# Patient Record
Sex: Female | Born: 1940 | Race: White | Hispanic: No | Marital: Married | Smoking: Never smoker
Health system: Southern US, Community
[De-identification: ages and names within clinical notes are randomized; demographics above are authoritative.]

## PROBLEM LIST (undated history)

## (undated) DIAGNOSIS — C50919 Malignant neoplasm of unspecified site of unspecified female breast: Secondary | ICD-10-CM

---

## 1999-09-16 ENCOUNTER — Encounter: Admission: RE | Admit: 1999-09-16 | Discharge: 1999-09-16 | Payer: Self-pay | Admitting: Family Medicine

## 1999-09-16 ENCOUNTER — Encounter: Payer: Self-pay | Admitting: Family Medicine

## 1999-10-22 ENCOUNTER — Encounter: Admission: RE | Admit: 1999-10-22 | Discharge: 1999-10-22 | Payer: Self-pay | Admitting: Family Medicine

## 1999-10-22 ENCOUNTER — Encounter: Payer: Self-pay | Admitting: Family Medicine

## 2000-08-31 ENCOUNTER — Other Ambulatory Visit: Admission: RE | Admit: 2000-08-31 | Discharge: 2000-08-31 | Payer: Self-pay | Admitting: Family Medicine

## 2000-12-06 ENCOUNTER — Encounter: Payer: Self-pay | Admitting: Family Medicine

## 2000-12-06 ENCOUNTER — Encounter: Admission: RE | Admit: 2000-12-06 | Discharge: 2000-12-06 | Payer: Self-pay | Admitting: Family Medicine

## 2002-03-16 ENCOUNTER — Encounter: Payer: Self-pay | Admitting: Family Medicine

## 2002-03-16 ENCOUNTER — Encounter: Admission: RE | Admit: 2002-03-16 | Discharge: 2002-03-16 | Payer: Self-pay | Admitting: Family Medicine

## 2004-05-01 ENCOUNTER — Encounter: Admission: RE | Admit: 2004-05-01 | Discharge: 2004-05-01 | Payer: Self-pay | Admitting: Family Medicine

## 2004-05-14 ENCOUNTER — Other Ambulatory Visit: Admission: RE | Admit: 2004-05-14 | Discharge: 2004-05-14 | Payer: Self-pay | Admitting: Family Medicine

## 2005-06-01 ENCOUNTER — Encounter: Admission: RE | Admit: 2005-06-01 | Discharge: 2005-06-01 | Payer: Self-pay | Admitting: Family Medicine

## 2006-04-14 ENCOUNTER — Other Ambulatory Visit: Admission: RE | Admit: 2006-04-14 | Discharge: 2006-04-14 | Payer: Self-pay | Admitting: Family Medicine

## 2006-06-30 ENCOUNTER — Encounter: Admission: RE | Admit: 2006-06-30 | Discharge: 2006-06-30 | Payer: Self-pay | Admitting: Family Medicine

## 2006-07-13 ENCOUNTER — Encounter (INDEPENDENT_AMBULATORY_CARE_PROVIDER_SITE_OTHER): Payer: Self-pay | Admitting: Diagnostic Radiology

## 2006-07-13 ENCOUNTER — Encounter: Admission: RE | Admit: 2006-07-13 | Discharge: 2006-07-13 | Payer: Self-pay | Admitting: Family Medicine

## 2006-07-13 ENCOUNTER — Encounter (INDEPENDENT_AMBULATORY_CARE_PROVIDER_SITE_OTHER): Payer: Self-pay | Admitting: Specialist

## 2006-07-25 ENCOUNTER — Encounter: Admission: RE | Admit: 2006-07-25 | Discharge: 2006-07-25 | Payer: Self-pay | Admitting: Family Medicine

## 2006-07-27 ENCOUNTER — Encounter: Admission: RE | Admit: 2006-07-27 | Discharge: 2006-07-27 | Payer: Self-pay | Admitting: Family Medicine

## 2006-08-19 ENCOUNTER — Encounter: Admission: RE | Admit: 2006-08-19 | Discharge: 2006-08-19 | Payer: Self-pay | Admitting: Surgery

## 2006-08-22 ENCOUNTER — Encounter (INDEPENDENT_AMBULATORY_CARE_PROVIDER_SITE_OTHER): Payer: Self-pay | Admitting: *Deleted

## 2006-08-22 ENCOUNTER — Ambulatory Visit (HOSPITAL_BASED_OUTPATIENT_CLINIC_OR_DEPARTMENT_OTHER): Admission: RE | Admit: 2006-08-22 | Discharge: 2006-08-22 | Payer: Self-pay | Admitting: Surgery

## 2006-08-22 ENCOUNTER — Encounter: Admission: RE | Admit: 2006-08-22 | Discharge: 2006-08-22 | Payer: Self-pay | Admitting: Surgery

## 2006-09-15 ENCOUNTER — Ambulatory Visit: Payer: Self-pay | Admitting: Oncology

## 2006-09-20 HISTORY — PX: BREAST LUMPECTOMY: SHX2

## 2006-09-21 ENCOUNTER — Ambulatory Visit: Admission: RE | Admit: 2006-09-21 | Discharge: 2006-12-10 | Payer: Self-pay | Admitting: *Deleted

## 2006-09-22 ENCOUNTER — Encounter: Admission: RE | Admit: 2006-09-22 | Discharge: 2006-09-22 | Payer: Self-pay | Admitting: Surgery

## 2006-09-22 ENCOUNTER — Encounter (INDEPENDENT_AMBULATORY_CARE_PROVIDER_SITE_OTHER): Payer: Self-pay | Admitting: Specialist

## 2006-10-03 LAB — COMPREHENSIVE METABOLIC PANEL
Albumin: 4.8 g/dL (ref 3.5–5.2)
CO2: 24 mEq/L (ref 19–32)
Calcium: 9.5 mg/dL (ref 8.4–10.5)
Chloride: 106 mEq/L (ref 96–112)
Glucose, Bld: 85 mg/dL (ref 70–99)
Potassium: 3.6 mEq/L (ref 3.5–5.3)
Sodium: 136 mEq/L (ref 135–145)
Total Protein: 7.6 g/dL (ref 6.0–8.3)

## 2006-10-03 LAB — CBC WITH DIFFERENTIAL/PLATELET
Basophils Absolute: 0 10*3/uL (ref 0.0–0.1)
Eosinophils Absolute: 0.2 10*3/uL (ref 0.0–0.5)
HGB: 12.8 g/dL (ref 11.6–15.9)
MONO#: 0.7 10*3/uL (ref 0.1–0.9)
NEUT#: 3.7 10*3/uL (ref 1.5–6.5)
RBC: 4.25 10*6/uL (ref 3.70–5.32)
RDW: 12.9 % (ref 11.3–14.5)
WBC: 6.2 10*3/uL (ref 3.9–10.0)
lymph#: 1.6 10*3/uL (ref 0.9–3.3)

## 2007-06-16 ENCOUNTER — Ambulatory Visit: Payer: Self-pay | Admitting: Oncology

## 2007-06-20 LAB — LIPID PANEL
Cholesterol: 187 mg/dL (ref 0–200)
LDL Cholesterol: 98 mg/dL (ref 0–99)
Triglycerides: 121 mg/dL (ref <150)

## 2007-07-03 ENCOUNTER — Encounter: Admission: RE | Admit: 2007-07-03 | Discharge: 2007-07-03 | Payer: Self-pay | Admitting: Oncology

## 2008-02-02 ENCOUNTER — Ambulatory Visit: Payer: Self-pay | Admitting: Oncology

## 2008-03-08 ENCOUNTER — Other Ambulatory Visit: Admission: RE | Admit: 2008-03-08 | Discharge: 2008-03-08 | Payer: Self-pay | Admitting: Family Medicine

## 2008-07-03 ENCOUNTER — Encounter: Admission: RE | Admit: 2008-07-03 | Discharge: 2008-07-03 | Payer: Self-pay | Admitting: Oncology

## 2009-03-11 ENCOUNTER — Ambulatory Visit: Payer: Self-pay | Admitting: Oncology

## 2009-03-13 LAB — COMPREHENSIVE METABOLIC PANEL
Albumin: 4.1 g/dL (ref 3.5–5.2)
BUN: 20 mg/dL (ref 6–23)
Calcium: 9.4 mg/dL (ref 8.4–10.5)
Chloride: 104 mEq/L (ref 96–112)
Creatinine, Ser: 0.89 mg/dL (ref 0.40–1.20)
Glucose, Bld: 87 mg/dL (ref 70–99)
Potassium: 4.4 mEq/L (ref 3.5–5.3)

## 2009-03-13 LAB — CBC WITH DIFFERENTIAL/PLATELET
Basophils Absolute: 0 10*3/uL (ref 0.0–0.1)
EOS%: 3 % (ref 0.0–7.0)
Eosinophils Absolute: 0.1 10*3/uL (ref 0.0–0.5)
HCT: 36.5 % (ref 34.8–46.6)
HGB: 12.5 g/dL (ref 11.6–15.9)
MCH: 29.9 pg (ref 25.1–34.0)
MCV: 87.2 fL (ref 79.5–101.0)
MONO%: 10.2 % (ref 0.0–14.0)
NEUT#: 2.5 10*3/uL (ref 1.5–6.5)
NEUT%: 57.6 % (ref 38.4–76.8)
RDW: 12.8 % (ref 11.2–14.5)
lymph#: 1.2 10*3/uL (ref 0.9–3.3)

## 2009-08-06 ENCOUNTER — Encounter: Admission: RE | Admit: 2009-08-06 | Discharge: 2009-08-06 | Payer: Self-pay | Admitting: Oncology

## 2010-03-06 ENCOUNTER — Ambulatory Visit: Payer: Self-pay | Admitting: Oncology

## 2010-03-10 LAB — COMPREHENSIVE METABOLIC PANEL
ALT: 14 U/L (ref 0–35)
AST: 20 U/L (ref 0–37)
CO2: 23 mEq/L (ref 19–32)
Creatinine, Ser: 0.94 mg/dL (ref 0.40–1.20)
Sodium: 137 mEq/L (ref 135–145)
Total Bilirubin: 0.4 mg/dL (ref 0.3–1.2)
Total Protein: 7 g/dL (ref 6.0–8.3)

## 2010-03-10 LAB — CBC WITH DIFFERENTIAL/PLATELET
BASO%: 0.7 % (ref 0.0–2.0)
EOS%: 2.1 % (ref 0.0–7.0)
LYMPH%: 31.1 % (ref 14.0–49.7)
MCH: 29.9 pg (ref 25.1–34.0)
MCHC: 34 g/dL (ref 31.5–36.0)
MONO#: 0.4 10*3/uL (ref 0.1–0.9)
Platelets: 197 10*3/uL (ref 145–400)
RBC: 4.11 10*6/uL (ref 3.70–5.45)
WBC: 4 10*3/uL (ref 3.9–10.3)
lymph#: 1.2 10*3/uL (ref 0.9–3.3)

## 2010-03-10 LAB — CANCER ANTIGEN 27.29: CA 27.29: 21 U/mL (ref 0–39)

## 2010-08-19 ENCOUNTER — Encounter: Admission: RE | Admit: 2010-08-19 | Discharge: 2010-08-19 | Payer: Self-pay | Admitting: Oncology

## 2011-02-05 NOTE — Op Note (Signed)
NAME:  Jasmine Porter, Jasmine Porter               ACCOUNT NO.:  [id number]   MEDICAL RECORD NO.:  [id number]          PATIENT TYPE:  AMB   LOCATION:  DSC                          FACILITY:  [facility]   PHYSICIAN:  Thomas A. 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   PROCEDURE:  Right breast needle localized lumpectomy.   SURGEON:  Harriette Bouillon, M.D.   ANESTHESIA:  MAC with 30 mL of 0.25% Sensorcaine plain.   ESTIMATED BLOOD LOSS:  10 mL.   SPECIMEN:  1. Right breast tissue with localizing wire to pathology.  Radiographs      revealed calcifications to be present.  2. Additional medial margin with stitch on new medial surgical margin.   INDICATIONS FOR PROCEDURE:  The patient is a 70 year old female found to  have a cluster of right breast microcalcifications.  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  Of  note, she had a clip placed, but this clip had migrated this was noted  at the time of her stereotactic core biopsy away from the  calcifications.  She presents today for excisional biopsy and she  elected to undergo a right breast lumpectomy for breast conservation for  DCIS of the right breast.   DESCRIPTION OF PROCEDURE:  The patient was brought to the operating room  after undergoing right breast needle localization.  Right breast was  prepped and draped in a sterile fashion.  The wire was localized and I  infiltrated 0.25% Sensorcaine in the right upper outer quadrant.  Incision was made and the wire was brought out through the incision.  The tissue surrounding the wire was excised using a scalpel and this was  passed off the field.  Radiograph revealed this  to contain the cluster  of microcalcifications.  There appeared to be a fairly generous margin  around this.  I took an additional medial margin of tissue as well since  the clip had migrated medial to make for this margin was clear and  grossly it appeared to be clear.  I then inspected the wound and found  it to be hemostatic.  This wound was irrigated out.  I closed the wound  in layers using 3-0 Vicryl and 4-0 Monocryl in  subcuticular fashion.  Steri-Strips and dry dressings were applied.  All  final counts of sponge, needle and instruments were found be correct  this portion of case.  The patient was awoke, taken to recovery in  satisfactory condition.  Tissue sent to pathology for evaluation.      Thomas A. Cornett, M.D.  Electronically Signed     TAC/MEDQ  D:  08/22/2006  T:  08/23/2006  Job:  [id number]   cc:   Duncan Dull, M.D.

## 2011-03-16 ENCOUNTER — Other Ambulatory Visit: Payer: Self-pay | Admitting: Oncology

## 2011-03-16 ENCOUNTER — Encounter (HOSPITAL_BASED_OUTPATIENT_CLINIC_OR_DEPARTMENT_OTHER): Payer: Medicare Other | Admitting: Oncology

## 2011-03-16 DIAGNOSIS — C50419 Malignant neoplasm of upper-outer quadrant of unspecified female breast: Secondary | ICD-10-CM

## 2011-03-16 DIAGNOSIS — D0590 Unspecified type of carcinoma in situ of unspecified breast: Secondary | ICD-10-CM

## 2011-03-16 DIAGNOSIS — Z170 Estrogen receptor positive status [ER+]: Secondary | ICD-10-CM

## 2011-03-16 LAB — CBC WITH DIFFERENTIAL/PLATELET
BASO%: 0.4 % (ref 0.0–2.0)
Basophils Absolute: 0 10*3/uL (ref 0.0–0.1)
EOS%: 2.4 % (ref 0.0–7.0)
HCT: 36.3 % (ref 34.8–46.6)
HGB: 12.4 g/dL (ref 11.6–15.9)
LYMPH%: 24.6 % (ref 14.0–49.7)
MCH: 30 pg (ref 25.1–34.0)
MCHC: 34.1 g/dL (ref 31.5–36.0)
MCV: 87.9 fL (ref 79.5–101.0)
MONO%: 10.6 % (ref 0.0–14.0)
NEUT%: 62 % (ref 38.4–76.8)
Platelets: 196 10*3/uL (ref 145–400)

## 2011-03-16 LAB — COMPREHENSIVE METABOLIC PANEL
ALT: 15 U/L (ref 0–35)
AST: 24 U/L (ref 0–37)
BUN: 15 mg/dL (ref 6–23)
Calcium: 9.6 mg/dL (ref 8.4–10.5)
Chloride: 102 mEq/L (ref 96–112)
Creatinine, Ser: 0.86 mg/dL (ref 0.50–1.10)
Total Bilirubin: 0.3 mg/dL (ref 0.3–1.2)

## 2011-08-03 ENCOUNTER — Other Ambulatory Visit: Payer: Self-pay | Admitting: Family Medicine

## 2011-08-03 DIAGNOSIS — Z853 Personal history of malignant neoplasm of breast: Secondary | ICD-10-CM

## 2011-08-03 DIAGNOSIS — Z9889 Other specified postprocedural states: Secondary | ICD-10-CM

## 2011-09-29 ENCOUNTER — Ambulatory Visit
Admission: RE | Admit: 2011-09-29 | Discharge: 2011-09-29 | Disposition: A | Discharge status: Home / Self Care | Payer: Medicare Other | Source: Ambulatory Visit | Attending: Family Medicine | Admitting: Family Medicine

## 2011-09-29 DIAGNOSIS — Z853 Personal history of malignant neoplasm of breast: Secondary | ICD-10-CM

## 2011-09-29 DIAGNOSIS — Z9889 Other specified postprocedural states: Secondary | ICD-10-CM

## 2012-03-29 DIAGNOSIS — H2510 Age-related nuclear cataract, unspecified eye: Secondary | ICD-10-CM | POA: Diagnosis not present

## 2012-03-29 DIAGNOSIS — H40019 Open angle with borderline findings, low risk, unspecified eye: Secondary | ICD-10-CM | POA: Diagnosis not present

## 2012-03-29 DIAGNOSIS — H353190 Nonexudative age-related macular degeneration, unspecified eye, stage unspecified: Secondary | ICD-10-CM | POA: Diagnosis not present

## 2012-03-29 DIAGNOSIS — H43819 Vitreous degeneration, unspecified eye: Secondary | ICD-10-CM | POA: Diagnosis not present

## 2012-07-18 DIAGNOSIS — Z23 Encounter for immunization: Secondary | ICD-10-CM | POA: Diagnosis not present

## 2012-10-17 ENCOUNTER — Other Ambulatory Visit: Payer: Self-pay | Admitting: Family Medicine

## 2012-10-17 DIAGNOSIS — Z853 Personal history of malignant neoplasm of breast: Secondary | ICD-10-CM

## 2012-10-19 ENCOUNTER — Other Ambulatory Visit: Payer: Self-pay | Admitting: Dermatology

## 2012-10-19 DIAGNOSIS — L820 Inflamed seborrheic keratosis: Secondary | ICD-10-CM | POA: Diagnosis not present

## 2012-10-19 DIAGNOSIS — D485 Neoplasm of uncertain behavior of skin: Secondary | ICD-10-CM | POA: Diagnosis not present

## 2012-12-06 ENCOUNTER — Ambulatory Visit
Admission: RE | Admit: 2012-12-06 | Discharge: 2012-12-06 | Disposition: A | Discharge status: Home / Self Care | Payer: Medicare Other | Source: Ambulatory Visit | Attending: Family Medicine | Admitting: Family Medicine

## 2012-12-06 DIAGNOSIS — Z853 Personal history of malignant neoplasm of breast: Secondary | ICD-10-CM | POA: Diagnosis not present

## 2013-04-04 DIAGNOSIS — H353190 Nonexudative age-related macular degeneration, unspecified eye, stage unspecified: Secondary | ICD-10-CM | POA: Diagnosis not present

## 2013-04-04 DIAGNOSIS — H2510 Age-related nuclear cataract, unspecified eye: Secondary | ICD-10-CM | POA: Diagnosis not present

## 2013-04-04 DIAGNOSIS — H43819 Vitreous degeneration, unspecified eye: Secondary | ICD-10-CM | POA: Diagnosis not present

## 2013-04-04 DIAGNOSIS — H40019 Open angle with borderline findings, low risk, unspecified eye: Secondary | ICD-10-CM | POA: Diagnosis not present

## 2013-07-02 DIAGNOSIS — Z23 Encounter for immunization: Secondary | ICD-10-CM | POA: Diagnosis not present

## 2013-10-23 ENCOUNTER — Telehealth (INDEPENDENT_AMBULATORY_CARE_PROVIDER_SITE_OTHER): Payer: Self-pay

## 2013-10-23 NOTE — Telephone Encounter (Signed)
No, I did not ask that question because I was trying to get her extensive medical history.

## 2013-10-23 NOTE — Telephone Encounter (Signed)
Spoke with home health and gave them number to reach pt if they had further questions. I spoke with pt's daughter via phone and she said her port [facility] and the Dr.  that installed the port has since passed away. Faxed demographics and this info to [facility]. KM

## 2013-10-23 NOTE — Telephone Encounter (Signed)
WRONG CHART, please dismiss. KM

## 2013-10-23 NOTE — Telephone Encounter (Signed)
[facility] calls asking if you know who is maintaining pt's port?

## 2013-12-13 ENCOUNTER — Other Ambulatory Visit: Payer: Self-pay

## 2013-12-13 DIAGNOSIS — Z1231 Encounter for screening mammogram for malignant neoplasm of breast: Secondary | ICD-10-CM

## 2014-01-01 ENCOUNTER — Ambulatory Visit
Admission: RE | Admit: 2014-01-01 | Discharge: 2014-01-01 | Disposition: A | Discharge status: Home / Self Care | Payer: Medicare Other | Source: Ambulatory Visit

## 2014-01-01 DIAGNOSIS — Z1231 Encounter for screening mammogram for malignant neoplasm of breast: Secondary | ICD-10-CM

## 2014-01-01 DIAGNOSIS — Z853 Personal history of malignant neoplasm of breast: Secondary | ICD-10-CM | POA: Diagnosis not present

## 2014-06-24 DIAGNOSIS — Z136 Encounter for screening for cardiovascular disorders: Secondary | ICD-10-CM | POA: Diagnosis not present

## 2014-06-24 DIAGNOSIS — Z0000 Encounter for general adult medical examination without abnormal findings: Secondary | ICD-10-CM | POA: Diagnosis not present

## 2014-06-24 DIAGNOSIS — Z23 Encounter for immunization: Secondary | ICD-10-CM | POA: Diagnosis not present

## 2014-06-24 DIAGNOSIS — M810 Age-related osteoporosis without current pathological fracture: Secondary | ICD-10-CM | POA: Diagnosis not present

## 2014-06-24 DIAGNOSIS — Z13220 Encounter for screening for lipoid disorders: Secondary | ICD-10-CM | POA: Diagnosis not present

## 2014-06-24 DIAGNOSIS — Z131 Encounter for screening for diabetes mellitus: Secondary | ICD-10-CM | POA: Diagnosis not present

## 2014-06-24 DIAGNOSIS — Z1389 Encounter for screening for other disorder: Secondary | ICD-10-CM | POA: Diagnosis not present

## 2014-06-24 DIAGNOSIS — E559 Vitamin D deficiency, unspecified: Secondary | ICD-10-CM | POA: Diagnosis not present

## 2014-09-26 DIAGNOSIS — E559 Vitamin D deficiency, unspecified: Secondary | ICD-10-CM | POA: Diagnosis not present

## 2014-10-03 DIAGNOSIS — M810 Age-related osteoporosis without current pathological fracture: Secondary | ICD-10-CM | POA: Diagnosis not present

## 2014-11-27 DIAGNOSIS — M810 Age-related osteoporosis without current pathological fracture: Secondary | ICD-10-CM | POA: Diagnosis not present

## 2014-11-27 DIAGNOSIS — E559 Vitamin D deficiency, unspecified: Secondary | ICD-10-CM | POA: Diagnosis not present

## 2014-12-26 ENCOUNTER — Other Ambulatory Visit: Payer: Self-pay

## 2014-12-26 DIAGNOSIS — Z1231 Encounter for screening mammogram for malignant neoplasm of breast: Secondary | ICD-10-CM

## 2015-01-14 ENCOUNTER — Ambulatory Visit
Admission: RE | Admit: 2015-01-14 | Discharge: 2015-01-14 | Disposition: A | Discharge status: Home / Self Care | Payer: Medicare Other | Source: Ambulatory Visit

## 2015-01-14 DIAGNOSIS — Z1231 Encounter for screening mammogram for malignant neoplasm of breast: Secondary | ICD-10-CM | POA: Diagnosis not present

## 2015-06-25 DIAGNOSIS — Z131 Encounter for screening for diabetes mellitus: Secondary | ICD-10-CM | POA: Diagnosis not present

## 2015-06-25 DIAGNOSIS — E559 Vitamin D deficiency, unspecified: Secondary | ICD-10-CM | POA: Diagnosis not present

## 2015-06-25 DIAGNOSIS — E7800 Pure hypercholesterolemia, unspecified: Secondary | ICD-10-CM | POA: Diagnosis not present

## 2015-07-02 DIAGNOSIS — E7800 Pure hypercholesterolemia, unspecified: Secondary | ICD-10-CM | POA: Diagnosis not present

## 2015-07-02 DIAGNOSIS — E559 Vitamin D deficiency, unspecified: Secondary | ICD-10-CM | POA: Diagnosis not present

## 2015-07-02 DIAGNOSIS — Z23 Encounter for immunization: Secondary | ICD-10-CM | POA: Diagnosis not present

## 2015-07-02 DIAGNOSIS — M810 Age-related osteoporosis without current pathological fracture: Secondary | ICD-10-CM | POA: Diagnosis not present

## 2015-07-02 DIAGNOSIS — Z0000 Encounter for general adult medical examination without abnormal findings: Secondary | ICD-10-CM | POA: Diagnosis not present

## 2015-07-16 DIAGNOSIS — H40013 Open angle with borderline findings, low risk, bilateral: Secondary | ICD-10-CM | POA: Diagnosis not present

## 2015-07-16 DIAGNOSIS — H353130 Nonexudative age-related macular degeneration, bilateral, stage unspecified: Secondary | ICD-10-CM | POA: Diagnosis not present

## 2015-07-16 DIAGNOSIS — H25013 Cortical age-related cataract, bilateral: Secondary | ICD-10-CM | POA: Diagnosis not present

## 2015-07-16 DIAGNOSIS — H2513 Age-related nuclear cataract, bilateral: Secondary | ICD-10-CM | POA: Diagnosis not present

## 2015-12-31 ENCOUNTER — Other Ambulatory Visit: Payer: Self-pay

## 2015-12-31 DIAGNOSIS — Z1231 Encounter for screening mammogram for malignant neoplasm of breast: Secondary | ICD-10-CM

## 2016-01-29 ENCOUNTER — Ambulatory Visit
Admission: RE | Admit: 2016-01-29 | Discharge: 2016-01-29 | Disposition: A | Discharge status: Home / Self Care | Payer: Medicare Other | Source: Ambulatory Visit

## 2016-01-29 DIAGNOSIS — Z1231 Encounter for screening mammogram for malignant neoplasm of breast: Secondary | ICD-10-CM

## 2016-01-30 ENCOUNTER — Other Ambulatory Visit: Payer: Self-pay | Admitting: Family Medicine

## 2016-01-30 DIAGNOSIS — R928 Other abnormal and inconclusive findings on diagnostic imaging of breast: Secondary | ICD-10-CM

## 2016-02-04 ENCOUNTER — Ambulatory Visit
Admission: RE | Admit: 2016-02-04 | Discharge: 2016-02-04 | Disposition: A | Discharge status: Home / Self Care | Payer: Medicare Other | Source: Ambulatory Visit | Attending: Family Medicine | Admitting: Family Medicine

## 2016-02-04 DIAGNOSIS — R928 Other abnormal and inconclusive findings on diagnostic imaging of breast: Secondary | ICD-10-CM

## 2016-02-12 DIAGNOSIS — H40013 Open angle with borderline findings, low risk, bilateral: Secondary | ICD-10-CM | POA: Diagnosis not present

## 2016-02-12 DIAGNOSIS — H40033 Anatomical narrow angle, bilateral: Secondary | ICD-10-CM | POA: Diagnosis not present

## 2016-06-30 DIAGNOSIS — E559 Vitamin D deficiency, unspecified: Secondary | ICD-10-CM | POA: Diagnosis not present

## 2016-06-30 DIAGNOSIS — Z131 Encounter for screening for diabetes mellitus: Secondary | ICD-10-CM | POA: Diagnosis not present

## 2016-06-30 DIAGNOSIS — E7800 Pure hypercholesterolemia, unspecified: Secondary | ICD-10-CM | POA: Diagnosis not present

## 2016-07-07 DIAGNOSIS — Z0000 Encounter for general adult medical examination without abnormal findings: Secondary | ICD-10-CM | POA: Diagnosis not present

## 2016-07-07 DIAGNOSIS — M810 Age-related osteoporosis without current pathological fracture: Secondary | ICD-10-CM | POA: Diagnosis not present

## 2016-07-07 DIAGNOSIS — E559 Vitamin D deficiency, unspecified: Secondary | ICD-10-CM | POA: Diagnosis not present

## 2016-07-07 DIAGNOSIS — E7800 Pure hypercholesterolemia, unspecified: Secondary | ICD-10-CM | POA: Diagnosis not present

## 2016-07-07 DIAGNOSIS — Z23 Encounter for immunization: Secondary | ICD-10-CM | POA: Diagnosis not present

## 2016-09-22 ENCOUNTER — Other Ambulatory Visit: Payer: Self-pay | Admitting: Family Medicine

## 2016-09-22 DIAGNOSIS — R234 Changes in skin texture: Secondary | ICD-10-CM

## 2016-09-24 ENCOUNTER — Ambulatory Visit
Admission: RE | Admit: 2016-09-24 | Discharge: 2016-09-24 | Disposition: A | Discharge status: Home / Self Care | Payer: Medicare Other | Source: Ambulatory Visit | Attending: Family Medicine | Admitting: Family Medicine

## 2016-09-24 DIAGNOSIS — R234 Changes in skin texture: Secondary | ICD-10-CM

## 2016-09-24 DIAGNOSIS — R928 Other abnormal and inconclusive findings on diagnostic imaging of breast: Secondary | ICD-10-CM | POA: Diagnosis not present

## 2016-09-24 DIAGNOSIS — N6489 Other specified disorders of breast: Secondary | ICD-10-CM | POA: Diagnosis not present

## 2016-10-13 DIAGNOSIS — H40013 Open angle with borderline findings, low risk, bilateral: Secondary | ICD-10-CM | POA: Diagnosis not present

## 2016-10-13 DIAGNOSIS — H353130 Nonexudative age-related macular degeneration, bilateral, stage unspecified: Secondary | ICD-10-CM | POA: Diagnosis not present

## 2016-10-13 DIAGNOSIS — H2513 Age-related nuclear cataract, bilateral: Secondary | ICD-10-CM | POA: Diagnosis not present

## 2016-10-13 DIAGNOSIS — H40033 Anatomical narrow angle, bilateral: Secondary | ICD-10-CM | POA: Diagnosis not present

## 2017-04-14 DIAGNOSIS — H40013 Open angle with borderline findings, low risk, bilateral: Secondary | ICD-10-CM | POA: Diagnosis not present

## 2017-04-14 DIAGNOSIS — H40033 Anatomical narrow angle, bilateral: Secondary | ICD-10-CM | POA: Diagnosis not present

## 2017-06-24 DIAGNOSIS — Z23 Encounter for immunization: Secondary | ICD-10-CM | POA: Diagnosis not present

## 2017-07-21 DIAGNOSIS — E559 Vitamin D deficiency, unspecified: Secondary | ICD-10-CM | POA: Diagnosis not present

## 2017-07-21 DIAGNOSIS — Z0000 Encounter for general adult medical examination without abnormal findings: Secondary | ICD-10-CM | POA: Diagnosis not present

## 2017-07-21 DIAGNOSIS — E7800 Pure hypercholesterolemia, unspecified: Secondary | ICD-10-CM | POA: Diagnosis not present

## 2017-07-21 DIAGNOSIS — Z131 Encounter for screening for diabetes mellitus: Secondary | ICD-10-CM | POA: Diagnosis not present

## 2017-07-21 DIAGNOSIS — D692 Other nonthrombocytopenic purpura: Secondary | ICD-10-CM | POA: Diagnosis not present

## 2017-07-21 DIAGNOSIS — M810 Age-related osteoporosis without current pathological fracture: Secondary | ICD-10-CM | POA: Diagnosis not present

## 2017-10-18 DIAGNOSIS — H40013 Open angle with borderline findings, low risk, bilateral: Secondary | ICD-10-CM | POA: Diagnosis not present

## 2017-10-18 DIAGNOSIS — H25013 Cortical age-related cataract, bilateral: Secondary | ICD-10-CM | POA: Diagnosis not present

## 2017-10-18 DIAGNOSIS — H40033 Anatomical narrow angle, bilateral: Secondary | ICD-10-CM | POA: Diagnosis not present

## 2017-10-18 DIAGNOSIS — H2513 Age-related nuclear cataract, bilateral: Secondary | ICD-10-CM | POA: Diagnosis not present

## 2017-11-11 ENCOUNTER — Other Ambulatory Visit: Payer: Self-pay | Admitting: Family Medicine

## 2017-11-11 DIAGNOSIS — Z139 Encounter for screening, unspecified: Secondary | ICD-10-CM

## 2017-12-05 ENCOUNTER — Ambulatory Visit
Admission: RE | Admit: 2017-12-05 | Discharge: 2017-12-05 | Disposition: A | Discharge status: Home / Self Care | Payer: Medicare Other | Source: Ambulatory Visit | Attending: Family Medicine | Admitting: Family Medicine

## 2017-12-05 DIAGNOSIS — Z1231 Encounter for screening mammogram for malignant neoplasm of breast: Secondary | ICD-10-CM | POA: Diagnosis not present

## 2017-12-05 DIAGNOSIS — Z139 Encounter for screening, unspecified: Secondary | ICD-10-CM

## 2018-04-25 DIAGNOSIS — H40013 Open angle with borderline findings, low risk, bilateral: Secondary | ICD-10-CM | POA: Diagnosis not present

## 2018-04-25 DIAGNOSIS — H40033 Anatomical narrow angle, bilateral: Secondary | ICD-10-CM | POA: Diagnosis not present

## 2018-06-27 DIAGNOSIS — Z23 Encounter for immunization: Secondary | ICD-10-CM | POA: Diagnosis not present

## 2018-07-19 DIAGNOSIS — L564 Polymorphous light eruption: Secondary | ICD-10-CM | POA: Diagnosis not present

## 2018-07-27 DIAGNOSIS — E7800 Pure hypercholesterolemia, unspecified: Secondary | ICD-10-CM | POA: Diagnosis not present

## 2018-07-27 DIAGNOSIS — M810 Age-related osteoporosis without current pathological fracture: Secondary | ICD-10-CM | POA: Diagnosis not present

## 2018-07-27 DIAGNOSIS — Z23 Encounter for immunization: Secondary | ICD-10-CM | POA: Diagnosis not present

## 2018-07-27 DIAGNOSIS — Z131 Encounter for screening for diabetes mellitus: Secondary | ICD-10-CM | POA: Diagnosis not present

## 2018-07-27 DIAGNOSIS — Z0000 Encounter for general adult medical examination without abnormal findings: Secondary | ICD-10-CM | POA: Diagnosis not present

## 2018-07-27 DIAGNOSIS — E559 Vitamin D deficiency, unspecified: Secondary | ICD-10-CM | POA: Diagnosis not present

## 2018-10-23 DIAGNOSIS — H40033 Anatomical narrow angle, bilateral: Secondary | ICD-10-CM | POA: Diagnosis not present

## 2018-10-23 DIAGNOSIS — H2513 Age-related nuclear cataract, bilateral: Secondary | ICD-10-CM | POA: Diagnosis not present

## 2018-10-23 DIAGNOSIS — H353132 Nonexudative age-related macular degeneration, bilateral, intermediate dry stage: Secondary | ICD-10-CM | POA: Diagnosis not present

## 2018-10-23 DIAGNOSIS — H40013 Open angle with borderline findings, low risk, bilateral: Secondary | ICD-10-CM | POA: Diagnosis not present

## 2019-01-19 ENCOUNTER — Other Ambulatory Visit: Payer: Self-pay | Admitting: Family Medicine

## 2019-01-19 DIAGNOSIS — Z1231 Encounter for screening mammogram for malignant neoplasm of breast: Secondary | ICD-10-CM

## 2019-03-27 ENCOUNTER — Ambulatory Visit
Admission: RE | Admit: 2019-03-27 | Discharge: 2019-03-27 | Disposition: A | Discharge status: Home / Self Care | Payer: Medicare Other | Source: Ambulatory Visit | Attending: Family Medicine | Admitting: Family Medicine

## 2019-03-27 DIAGNOSIS — Z1231 Encounter for screening mammogram for malignant neoplasm of breast: Secondary | ICD-10-CM

## 2019-04-23 DIAGNOSIS — H40033 Anatomical narrow angle, bilateral: Secondary | ICD-10-CM | POA: Diagnosis not present

## 2019-04-23 DIAGNOSIS — H40013 Open angle with borderline findings, low risk, bilateral: Secondary | ICD-10-CM | POA: Diagnosis not present

## 2019-06-22 DIAGNOSIS — Z23 Encounter for immunization: Secondary | ICD-10-CM | POA: Diagnosis not present

## 2019-10-12 ENCOUNTER — Ambulatory Visit: Payer: Medicare Other | Attending: Family Medicine

## 2019-10-12 DIAGNOSIS — Z23 Encounter for immunization: Secondary | ICD-10-CM | POA: Insufficient documentation

## 2019-10-12 NOTE — Progress Notes (Signed)
   Covid-19 Vaccination Clinic  Name:  Jasmine Porter    MRN: [id number] DOB: 1941-05-01  10/12/2019  Jasmine Porter was observed post Covid-19 immunization for 15 minutes without incidence. She was provided with Vaccine Information Sheet and instruction to access the V-Safe system.   Jasmine Porter was instructed to call 911 with any severe reactions post vaccine: Marland Kitchen Difficulty breathing  . Swelling of your face and throat  . A fast heartbeat  . A bad rash all over your body  . Dizziness and weakness    Immunizations Administered    Name Date Dose VIS Date Route   Pfizer COVID-19 Vaccine 10/12/2019 10:57 AM 0.3 mL 08/31/2019 Intramuscular   Manufacturer: [place]   Lot: [id number]   [place]: [id number]

## 2019-10-23 DIAGNOSIS — L309 Dermatitis, unspecified: Secondary | ICD-10-CM | POA: Diagnosis not present

## 2019-10-23 DIAGNOSIS — L820 Inflamed seborrheic keratosis: Secondary | ICD-10-CM | POA: Diagnosis not present

## 2019-10-23 DIAGNOSIS — D485 Neoplasm of uncertain behavior of skin: Secondary | ICD-10-CM | POA: Diagnosis not present

## 2019-10-23 DIAGNOSIS — L570 Actinic keratosis: Secondary | ICD-10-CM | POA: Diagnosis not present

## 2019-10-23 DIAGNOSIS — Z23 Encounter for immunization: Secondary | ICD-10-CM | POA: Diagnosis not present

## 2019-10-25 DIAGNOSIS — H25013 Cortical age-related cataract, bilateral: Secondary | ICD-10-CM | POA: Diagnosis not present

## 2019-10-25 DIAGNOSIS — H40013 Open angle with borderline findings, low risk, bilateral: Secondary | ICD-10-CM | POA: Diagnosis not present

## 2019-10-25 DIAGNOSIS — H2513 Age-related nuclear cataract, bilateral: Secondary | ICD-10-CM | POA: Diagnosis not present

## 2019-10-25 DIAGNOSIS — H40033 Anatomical narrow angle, bilateral: Secondary | ICD-10-CM | POA: Diagnosis not present

## 2019-11-02 ENCOUNTER — Ambulatory Visit: Payer: Medicare Other | Attending: Internal Medicine

## 2019-11-02 DIAGNOSIS — Z23 Encounter for immunization: Secondary | ICD-10-CM | POA: Insufficient documentation

## 2019-11-02 NOTE — Progress Notes (Signed)
   Covid-19 Vaccination Clinic  Name:  Jasmine Porter    MRN: [id number] DOB: 01/09/1941  11/02/2019  Jasmine Porter was observed post Covid-19 immunization for 30 minutes based on pre-vaccination screening without incidence. She was provided with Vaccine Information Sheet and instruction to access the V-Safe system.   Jasmine Porter was instructed to call 911 with any severe reactions post vaccine: Marland Kitchen Difficulty breathing  . Swelling of your face and throat  . A fast heartbeat  . A bad rash all over your body  . Dizziness and weakness    Immunizations Administered    Name Date Dose VIS Date Route   Pfizer COVID-19 Vaccine 11/02/2019  2:32 PM 0.3 mL 08/31/2019 Intramuscular   Manufacturer: [place]   Lot: [id number]   [place]: [id number]

## 2020-02-07 DIAGNOSIS — Z0000 Encounter for general adult medical examination without abnormal findings: Secondary | ICD-10-CM | POA: Diagnosis not present

## 2020-02-07 DIAGNOSIS — M810 Age-related osteoporosis without current pathological fracture: Secondary | ICD-10-CM | POA: Diagnosis not present

## 2020-02-07 DIAGNOSIS — E7800 Pure hypercholesterolemia, unspecified: Secondary | ICD-10-CM | POA: Diagnosis not present

## 2020-02-07 DIAGNOSIS — E559 Vitamin D deficiency, unspecified: Secondary | ICD-10-CM | POA: Diagnosis not present

## 2020-02-28 DIAGNOSIS — H40013 Open angle with borderline findings, low risk, bilateral: Secondary | ICD-10-CM | POA: Diagnosis not present

## 2020-02-28 DIAGNOSIS — H25013 Cortical age-related cataract, bilateral: Secondary | ICD-10-CM | POA: Diagnosis not present

## 2020-02-28 DIAGNOSIS — H40033 Anatomical narrow angle, bilateral: Secondary | ICD-10-CM | POA: Diagnosis not present

## 2020-02-28 DIAGNOSIS — H2513 Age-related nuclear cataract, bilateral: Secondary | ICD-10-CM | POA: Diagnosis not present

## 2020-03-03 DIAGNOSIS — H40033 Anatomical narrow angle, bilateral: Secondary | ICD-10-CM | POA: Diagnosis not present

## 2020-03-03 DIAGNOSIS — H2513 Age-related nuclear cataract, bilateral: Secondary | ICD-10-CM | POA: Diagnosis not present

## 2020-03-03 DIAGNOSIS — H40013 Open angle with borderline findings, low risk, bilateral: Secondary | ICD-10-CM | POA: Diagnosis not present

## 2020-03-03 DIAGNOSIS — H2512 Age-related nuclear cataract, left eye: Secondary | ICD-10-CM | POA: Diagnosis not present

## 2020-03-03 DIAGNOSIS — H25013 Cortical age-related cataract, bilateral: Secondary | ICD-10-CM | POA: Diagnosis not present

## 2020-03-18 DIAGNOSIS — T7840XA Allergy, unspecified, initial encounter: Secondary | ICD-10-CM | POA: Diagnosis not present

## 2020-03-18 DIAGNOSIS — L309 Dermatitis, unspecified: Secondary | ICD-10-CM | POA: Diagnosis not present

## 2020-03-18 DIAGNOSIS — L570 Actinic keratosis: Secondary | ICD-10-CM | POA: Diagnosis not present

## 2020-04-02 DIAGNOSIS — H25812 Combined forms of age-related cataract, left eye: Secondary | ICD-10-CM | POA: Diagnosis not present

## 2020-04-02 DIAGNOSIS — H2512 Age-related nuclear cataract, left eye: Secondary | ICD-10-CM | POA: Diagnosis not present

## 2020-04-29 DIAGNOSIS — H2511 Age-related nuclear cataract, right eye: Secondary | ICD-10-CM | POA: Diagnosis not present

## 2020-04-29 DIAGNOSIS — H25011 Cortical age-related cataract, right eye: Secondary | ICD-10-CM | POA: Diagnosis not present

## 2020-04-30 ENCOUNTER — Other Ambulatory Visit: Payer: Self-pay | Admitting: Family Medicine

## 2020-04-30 DIAGNOSIS — Z1231 Encounter for screening mammogram for malignant neoplasm of breast: Secondary | ICD-10-CM

## 2020-05-21 DIAGNOSIS — H25011 Cortical age-related cataract, right eye: Secondary | ICD-10-CM | POA: Diagnosis not present

## 2020-05-21 DIAGNOSIS — H25811 Combined forms of age-related cataract, right eye: Secondary | ICD-10-CM | POA: Diagnosis not present

## 2020-05-21 DIAGNOSIS — H2511 Age-related nuclear cataract, right eye: Secondary | ICD-10-CM | POA: Diagnosis not present

## 2020-06-10 ENCOUNTER — Other Ambulatory Visit: Payer: Self-pay

## 2020-06-10 ENCOUNTER — Ambulatory Visit
Admission: RE | Admit: 2020-06-10 | Discharge: 2020-06-10 | Disposition: A | Discharge status: Home / Self Care | Payer: Medicare Other | Source: Ambulatory Visit | Attending: Family Medicine | Admitting: Family Medicine

## 2020-06-10 DIAGNOSIS — Z1231 Encounter for screening mammogram for malignant neoplasm of breast: Secondary | ICD-10-CM

## 2020-06-10 HISTORY — DX: Malignant neoplasm of unspecified site of unspecified female breast: C50.919

## 2020-06-11 ENCOUNTER — Other Ambulatory Visit: Payer: Self-pay | Admitting: Family Medicine

## 2020-06-11 DIAGNOSIS — R928 Other abnormal and inconclusive findings on diagnostic imaging of breast: Secondary | ICD-10-CM

## 2020-06-26 ENCOUNTER — Ambulatory Visit: Payer: Medicare Other

## 2020-06-26 ENCOUNTER — Ambulatory Visit
Admission: RE | Admit: 2020-06-26 | Discharge: 2020-06-26 | Disposition: A | Discharge status: Home / Self Care | Payer: Medicare Other | Source: Ambulatory Visit | Attending: Family Medicine | Admitting: Family Medicine

## 2020-06-26 ENCOUNTER — Other Ambulatory Visit: Payer: Self-pay

## 2020-06-26 DIAGNOSIS — R922 Inconclusive mammogram: Secondary | ICD-10-CM | POA: Diagnosis not present

## 2020-06-26 DIAGNOSIS — R928 Other abnormal and inconclusive findings on diagnostic imaging of breast: Secondary | ICD-10-CM

## 2020-07-10 DIAGNOSIS — Z23 Encounter for immunization: Secondary | ICD-10-CM | POA: Diagnosis not present

## 2020-07-22 DIAGNOSIS — S8264XA Nondisplaced fracture of lateral malleolus of right fibula, initial encounter for closed fracture: Secondary | ICD-10-CM | POA: Diagnosis not present

## 2020-07-29 DIAGNOSIS — S8264XD Nondisplaced fracture of lateral malleolus of right fibula, subsequent encounter for closed fracture with routine healing: Secondary | ICD-10-CM | POA: Diagnosis not present

## 2020-08-02 ENCOUNTER — Other Ambulatory Visit: Payer: Self-pay

## 2020-08-02 ENCOUNTER — Ambulatory Visit: Payer: Medicare Other | Attending: Internal Medicine

## 2020-08-02 DIAGNOSIS — Z23 Encounter for immunization: Secondary | ICD-10-CM

## 2020-08-02 NOTE — Progress Notes (Signed)
   Covid-19 Vaccination Clinic  Name:  SHEROLYN TRETTIN    MRN: [id number] DOB: Jul 25, 1941  08/02/2020  Ms. Vancamp was observed post Covid-19 immunization for 15 minutes without incident. She was provided with Vaccine Information Sheet and instruction to access the V-Safe system.   Ms. Musleh was instructed to call 911 with any severe reactions post vaccine: Marland Kitchen Difficulty breathing  . Swelling of face and throat  . A fast heartbeat  . A bad rash all over body  . Dizziness and weakness   Immunizations Administered    Name Date Dose VIS Date Route   Pfizer COVID-19 Vaccine 08/02/2020  3:34 PM 0.3 mL 07/09/2020 Intramuscular   Manufacturer: [place]   Lot: [id number]   [place]: [id number]

## 2020-08-12 DIAGNOSIS — S8264XD Nondisplaced fracture of lateral malleolus of right fibula, subsequent encounter for closed fracture with routine healing: Secondary | ICD-10-CM | POA: Diagnosis not present

## 2020-08-22 DIAGNOSIS — S8264XD Nondisplaced fracture of lateral malleolus of right fibula, subsequent encounter for closed fracture with routine healing: Secondary | ICD-10-CM | POA: Diagnosis not present

## 2020-09-01 DIAGNOSIS — S8264XD Nondisplaced fracture of lateral malleolus of right fibula, subsequent encounter for closed fracture with routine healing: Secondary | ICD-10-CM | POA: Diagnosis not present

## 2020-09-15 DIAGNOSIS — S8264XD Nondisplaced fracture of lateral malleolus of right fibula, subsequent encounter for closed fracture with routine healing: Secondary | ICD-10-CM | POA: Diagnosis not present

## 2020-12-16 DIAGNOSIS — H26491 Other secondary cataract, right eye: Secondary | ICD-10-CM | POA: Diagnosis not present

## 2020-12-16 DIAGNOSIS — Z961 Presence of intraocular lens: Secondary | ICD-10-CM | POA: Diagnosis not present

## 2020-12-16 DIAGNOSIS — H40013 Open angle with borderline findings, low risk, bilateral: Secondary | ICD-10-CM | POA: Diagnosis not present

## 2021-06-02 DIAGNOSIS — L570 Actinic keratosis: Secondary | ICD-10-CM | POA: Diagnosis not present

## 2021-06-02 DIAGNOSIS — C44311 Basal cell carcinoma of skin of nose: Secondary | ICD-10-CM | POA: Diagnosis not present

## 2021-06-02 DIAGNOSIS — D485 Neoplasm of uncertain behavior of skin: Secondary | ICD-10-CM | POA: Diagnosis not present

## 2021-06-02 DIAGNOSIS — L821 Other seborrheic keratosis: Secondary | ICD-10-CM | POA: Diagnosis not present

## 2021-06-17 DIAGNOSIS — E559 Vitamin D deficiency, unspecified: Secondary | ICD-10-CM | POA: Diagnosis not present

## 2021-06-17 DIAGNOSIS — E7800 Pure hypercholesterolemia, unspecified: Secondary | ICD-10-CM | POA: Diagnosis not present

## 2021-06-17 DIAGNOSIS — M810 Age-related osteoporosis without current pathological fracture: Secondary | ICD-10-CM | POA: Diagnosis not present

## 2021-06-22 DIAGNOSIS — Z0000 Encounter for general adult medical examination without abnormal findings: Secondary | ICD-10-CM | POA: Diagnosis not present

## 2021-06-22 DIAGNOSIS — E559 Vitamin D deficiency, unspecified: Secondary | ICD-10-CM | POA: Diagnosis not present

## 2021-06-22 DIAGNOSIS — E7800 Pure hypercholesterolemia, unspecified: Secondary | ICD-10-CM | POA: Diagnosis not present

## 2021-06-22 DIAGNOSIS — M810 Age-related osteoporosis without current pathological fracture: Secondary | ICD-10-CM | POA: Diagnosis not present

## 2021-07-07 DIAGNOSIS — C44311 Basal cell carcinoma of skin of nose: Secondary | ICD-10-CM | POA: Diagnosis not present

## 2021-07-07 DIAGNOSIS — C4491 Basal cell carcinoma of skin, unspecified: Secondary | ICD-10-CM | POA: Diagnosis not present

## 2021-07-07 DIAGNOSIS — Z481 Encounter for planned postprocedural wound closure: Secondary | ICD-10-CM | POA: Diagnosis not present

## 2021-07-14 ENCOUNTER — Other Ambulatory Visit (HOSPITAL_BASED_OUTPATIENT_CLINIC_OR_DEPARTMENT_OTHER): Payer: Self-pay

## 2021-07-14 ENCOUNTER — Ambulatory Visit: Payer: Medicare Other | Attending: Internal Medicine

## 2021-07-14 DIAGNOSIS — Z23 Encounter for immunization: Secondary | ICD-10-CM

## 2021-07-14 MED ORDER — PFIZER COVID-19 VAC BIVALENT 30 MCG/0.3ML IM SUSP
INTRAMUSCULAR | 0 refills | Status: AC
  Filled 2021-07-14: qty 0.3, 1d supply, fill #0

## 2021-07-14 NOTE — Progress Notes (Signed)
   Covid-19 Vaccination Clinic  Name:  Jasmine Porter    MRN: [id number] DOB: 16-Feb-1941  07/14/2021  Jasmine Porter was observed post Covid-19 immunization for 15 minutes without incident. She was provided with Vaccine Information Sheet and instruction to access the V-Safe system.   Jasmine Porter was instructed to call 911 with any severe reactions post vaccine: Difficulty breathing  Swelling of face and throat  A fast heartbeat  A bad rash all over body  Dizziness and weakness   Immunizations Administered     Name Date Dose VIS Date Route   Pfizer Covid-19 Vaccine Bivalent Booster 07/14/2021  2:37 PM 0.3 mL 05/20/2021 Intramuscular   Manufacturer: [place]   Lot: [id number]   [place]: [phone]

## 2021-07-15 DIAGNOSIS — Z961 Presence of intraocular lens: Secondary | ICD-10-CM | POA: Diagnosis not present

## 2021-07-15 DIAGNOSIS — H40013 Open angle with borderline findings, low risk, bilateral: Secondary | ICD-10-CM | POA: Diagnosis not present

## 2021-07-15 DIAGNOSIS — H26491 Other secondary cataract, right eye: Secondary | ICD-10-CM | POA: Diagnosis not present

## 2021-07-15 DIAGNOSIS — H353132 Nonexudative age-related macular degeneration, bilateral, intermediate dry stage: Secondary | ICD-10-CM | POA: Diagnosis not present

## 2021-07-21 DIAGNOSIS — Z23 Encounter for immunization: Secondary | ICD-10-CM | POA: Diagnosis not present

## 2021-07-24 DIAGNOSIS — Z481 Encounter for planned postprocedural wound closure: Secondary | ICD-10-CM | POA: Diagnosis not present

## 2021-07-24 DIAGNOSIS — C44311 Basal cell carcinoma of skin of nose: Secondary | ICD-10-CM | POA: Diagnosis not present

## 2021-07-24 DIAGNOSIS — C4491 Basal cell carcinoma of skin, unspecified: Secondary | ICD-10-CM | POA: Diagnosis not present

## 2021-09-28 ENCOUNTER — Other Ambulatory Visit: Payer: Self-pay

## 2021-09-28 ENCOUNTER — Emergency Department (HOSPITAL_BASED_OUTPATIENT_CLINIC_OR_DEPARTMENT_OTHER)
Admission: EM | Admit: 2021-09-28 | Discharge: 2021-09-28 | Disposition: A | Discharge status: Home / Self Care | Payer: Medicare Other | Attending: Emergency Medicine | Admitting: Emergency Medicine

## 2021-09-28 ENCOUNTER — Emergency Department (HOSPITAL_BASED_OUTPATIENT_CLINIC_OR_DEPARTMENT_OTHER): Payer: Medicare Other

## 2021-09-28 ENCOUNTER — Encounter (HOSPITAL_BASED_OUTPATIENT_CLINIC_OR_DEPARTMENT_OTHER): Payer: Self-pay | Admitting: Emergency Medicine

## 2021-09-28 DIAGNOSIS — S0242XA Fracture of alveolus of maxilla, initial encounter for closed fracture: Secondary | ICD-10-CM | POA: Diagnosis not present

## 2021-09-28 DIAGNOSIS — Y9302 Activity, running: Secondary | ICD-10-CM | POA: Insufficient documentation

## 2021-09-28 DIAGNOSIS — S0993XA Unspecified injury of face, initial encounter: Secondary | ICD-10-CM | POA: Diagnosis not present

## 2021-09-28 DIAGNOSIS — W01198A Fall on same level from slipping, tripping and stumbling with subsequent striking against other object, initial encounter: Secondary | ICD-10-CM | POA: Insufficient documentation

## 2021-09-28 DIAGNOSIS — Z853 Personal history of malignant neoplasm of breast: Secondary | ICD-10-CM | POA: Diagnosis not present

## 2021-09-28 DIAGNOSIS — S0990XA Unspecified injury of head, initial encounter: Secondary | ICD-10-CM | POA: Insufficient documentation

## 2021-09-28 DIAGNOSIS — S0081XA Abrasion of other part of head, initial encounter: Secondary | ICD-10-CM | POA: Diagnosis not present

## 2021-09-28 DIAGNOSIS — Z043 Encounter for examination and observation following other accident: Secondary | ICD-10-CM | POA: Diagnosis not present

## 2021-09-28 DIAGNOSIS — S00502A Unspecified superficial injury of oral cavity, initial encounter: Secondary | ICD-10-CM | POA: Diagnosis not present

## 2021-09-28 DIAGNOSIS — Y92194 Driveway of other specified residential institution as the place of occurrence of the external cause: Secondary | ICD-10-CM | POA: Diagnosis not present

## 2021-09-28 DIAGNOSIS — W19XXXA Unspecified fall, initial encounter: Secondary | ICD-10-CM

## 2021-09-28 NOTE — ED Triage Notes (Signed)
Pt fell in the driveway after jogging this morning. Denies LOC. C/o facial injuries and knocked out a tooth. No blood thinners

## 2021-09-28 NOTE — Discharge Instructions (Signed)
You were seen in the emergency room today after a fall.  I would like for you to follow closely with your dentist today and they can refer you onto the appropriate specialist if needed.  You may take Tylenol as needed for pain and apply ice to help reduce swelling.

## 2021-09-28 NOTE — ED Provider Notes (Signed)
Emergency Department Provider Note   I have reviewed the triage vital signs and the nursing notes.   HISTORY  Chief Complaint Fall   HPI Jasmine Porter is a 81 y.o. female with past medical history reviewed below presents to the emergency department after mechanical fall with face injury.  Patient was out jogging this morning when she tripped while coming back into her driveway falling forward.  Denies loss of consciousness.  She notes that she knocked out a front tooth and that other teeth feel loose.  She also describes pain and swelling in the nose along with mild headache.  She denies pain in the wrists, arms/shoulders.  In the lower extremities.  She is ambulatory without difficulty.  She is not having any chest pain or shortness of breath. She is not anticoagulated.     Past Medical History:  Diagnosis Date   Breast cancer ([place])     Review of Systems  Constitutional: No fever/chills Eyes: No visual changes. ENT: No sore throat. Positive dental injury. Positive nose pain.  Cardiovascular: Denies chest pain. Respiratory: Denies shortness of breath. Gastrointestinal: No abdominal pain.   Musculoskeletal: Negative for back pain. Skin: No lacerations.  Neurological: Negative for focal weakness or numbness. Positive mild HA.   10-point ROS otherwise negative.  ____________________________________________   PHYSICAL EXAM:  VITAL SIGNS: ED Triage Vitals  Enc Vitals Group     BP 09/28/21 0722 (!) 161/80     Pulse Rate 09/28/21 0722 94     Resp 09/28/21 0722 16     Temp 09/28/21 0722 97.9 F (36.6 C)     Temp Source 09/28/21 0722 Oral     SpO2 09/28/21 0722 99 %     Weight 09/28/21 0719 90 lb (40.8 kg)     Height 09/28/21 0719 4\' 10"  (1.473 m)   Constitutional: Alert and oriented. Well appearing and in no acute distress. Eyes: Conjunctivae are normal. PERRL. EOMI. No periorbital swelling/ecchymosis.  Head: Atraumatic. Nose: No epistaxis or septal swelling.  Diffuse swelling to the bridge of the nose without laceration.  Mouth/Throat: Mucous membranes are moist. Missing teeth #8 and loose #9.  Neck: No stridor. No cervical spine tenderness to palpation. Cardiovascular: Normal rate, regular rhythm. Good peripheral circulation. Grossly normal heart sounds.   Respiratory: Normal respiratory effort.  No retractions. Lungs CTAB. Gastrointestinal: Soft and nontender. No distention.  Musculoskeletal: No lower extremity tenderness nor edema. No gross deformities of extremities. Normal ROM of the bilateral upper and lower extremities. No scaphoid tenderness.  Neurologic:  Normal speech and language. No gross focal neurologic deficits are appreciated.  Skin:  Skin is warm and dry. Slight chin abrasion w/o laceration.   ____________________________________________  RADIOLOGY  CT Head Wo Contrast  Result Date: 09/28/2021 CLINICAL DATA:  81 year old female status post fall in driveway this morning. Tooth avulsion. EXAM: CT HEAD WITHOUT CONTRAST TECHNIQUE: Contiguous axial images were obtained from the base of the skull through the vertex without intravenous contrast. COMPARISON:  Face CT reported separately. FINDINGS: Brain: No midline shift, mass effect, or evidence of intracranial mass lesion. No ventriculomegaly. No acute intracranial hemorrhage identified. Partial volume artifact at the anterior cortex of the right middle frontal gyrus on series 2, image 19 judging from the sagittal images. Gray-white matter differentiation appears within normal limits for age throughout the brain. No cortically based acute infarct identified. Vascular: Calcified atherosclerosis at the skull base. No suspicious intracranial vascular hyperdensity. Skull: No skull fracture identified. Sinuses/Orbits: Trace fluid in the  right sphenoid sinus but other Visualized paranasal sinuses and mastoids are clear. Other: Visualized orbits and scalp soft tissues are within normal limits. Bridge  of nose soft tissue swelling, see face CT reported separately. IMPRESSION: 1. No acute traumatic injury identified. Normal for age non contrast CT appearance of the brain. 2. See Face CT reported separately. Electronically Signed   By: Genevie Ann M.D.   On: 09/28/2021 08:04   CT Maxillofacial Wo Contrast  Result Date: 09/28/2021 CLINICAL DATA:  81 year old female status post fall in driveway this morning. Tooth avulsion. EXAM: CT MAXILLOFACIAL WITHOUT CONTRAST TECHNIQUE: Multidetector CT imaging of the maxillofacial structures was performed. Multiplanar CT image reconstructions were also generated. COMPARISON:  Head CT today. FINDINGS: Osseous: Mandible intact and normally located with some bilateral TMJ degeneration. Mandible dentition appears to remain intact. Comminuted fracture through the anterior maxillary alveolus with fractured and absent right maxillary medial incisor. Left maxillary incisors may be loose (series 4, image 52). Regional soft tissue injury including some soft tissue gas. The fracture separates the nasal process of the maxilla (series 4, image 48). But the superimposed nasal bones appear intact. Walls of the maxillary sinuses appear symmetric and intact. No pterygoid or zygoma fracture. Central skull base appears intact. Visible cervical vertebrae appear grossly intact. Orbits: Intact orbital walls. Globes and intraorbital soft tissues appears symmetric and intact. There is bulky fairly symmetric soft tissue swelling at the bridge of the nose. Sinuses: Paranasal sinuses remain clear throughout except for trace fluid in the lateral right sphenoid. Nasal septum seems to remain intact. Tympanic cavities and mastoids are clear. Soft tissues: Anterior superficial soft tissue injury as above, plus some soft tissue swelling at the mental process of the mandible. Negative visible noncontrast deep soft tissue spaces of the face. Limited intracranial: Stable to that reported separately. IMPRESSION: 1.  Midline comminuted fracture through the anterior maxillary alveolus with fractured and absent right maxillary medial incisor. Left maxillary incisors may be loose. Separated nasal process of the maxilla. But nasal bones and other facial bones appear intact. 2. Regional soft tissue injury including gas and soft tissue swelling. Soft tissue swelling at the bridge of the nose. Electronically Signed   By: Genevie Ann M.D.   On: 09/28/2021 08:09    ____________________________________________   PROCEDURES  Procedure(s) performed:   Procedures  None  ____________________________________________   INITIAL IMPRESSION / ASSESSMENT AND PLAN / ED COURSE  Pertinent labs & imaging results that were available during my care of the patient were reviewed by me and considered in my medical decision making (see chart for details).   This patient is Presenting for Evaluation of fall with face injury, which does require a range of treatment options, and is a complaint that involves a high risk of morbidity and mortality.  The Differential Diagnoses include dental fracture, mandibular fracture, other facial bone fracture, traumatic ICH, globe injury.  Critical Interventions- Pain assessment and CT imaging.     I did obtain Additional Historical Information from husband at bedside who confirms the history as documented above.   I decided to review pertinent External Data, and in summary patient with no recent ED or PCP notes for review.   Clinical Laboratory Tests Ordered, included considered CBC and chemistry but fall well-described as mechanical by patient.   Radiologic Tests Ordered, included CT head and max-face.    Social Determinants of Health Risk patient with good PCP follow up and established with dental practice.   Reevaluation with update and discussion  with patient and husband. Reviewed imaging results. Pain is mild to moderate but declines Rx or ED treatment for pain. Bleeding from dental  injury has stopped.    Medical Decision Making: Summary:  Patient presents to the emergency department with mechanical fall and mainly blunt face trauma.  She has a missing maxillary incisor and several loose teeth.  Bleeding is mostly controlled at the time of my assessment.  Low suspicion for mandibular fracture, entrapment from orbital fracture, or midface instability.  Patient does have some swelling to the bridge of the nose.  Plan for CT imaging of the head along with max face to assess for traumatic injuries and assist with proper referral.  No dental fracture with exposed pulp or dentin.   Disposition:  Patient stable for discharge.  She has called her dental office who will get her in to be seen later today.  Advised that she go there for initial evaluation and they can refer her on to a dental specialist as needed.   ____________________________________________  FINAL CLINICAL IMPRESSION(S) / ED DIAGNOSES  Final diagnoses:  Fall, initial encounter  Dental injury, initial encounter  Blunt trauma of face, initial encounter    Note:  This document was prepared using Dragon voice recognition software and may include unintentional dictation errors.  Nanda Quinton, MD, [facility] Emergency Medicine    Galileo Colello, Wonda [facility], MD 09/28/21 [phone]

## 2021-11-24 DIAGNOSIS — R229 Localized swelling, mass and lump, unspecified: Secondary | ICD-10-CM | POA: Diagnosis not present

## 2021-11-24 DIAGNOSIS — L821 Other seborrheic keratosis: Secondary | ICD-10-CM | POA: Diagnosis not present

## 2021-11-24 DIAGNOSIS — Z85828 Personal history of other malignant neoplasm of skin: Secondary | ICD-10-CM | POA: Diagnosis not present

## 2021-11-24 DIAGNOSIS — Z08 Encounter for follow-up examination after completed treatment for malignant neoplasm: Secondary | ICD-10-CM | POA: Diagnosis not present

## 2021-11-24 DIAGNOSIS — L309 Dermatitis, unspecified: Secondary | ICD-10-CM | POA: Diagnosis not present

## 2021-11-24 DIAGNOSIS — L814 Other melanin hyperpigmentation: Secondary | ICD-10-CM | POA: Diagnosis not present

## 2022-04-02 DIAGNOSIS — L821 Other seborrheic keratosis: Secondary | ICD-10-CM | POA: Diagnosis not present

## 2022-04-02 DIAGNOSIS — Z85828 Personal history of other malignant neoplasm of skin: Secondary | ICD-10-CM | POA: Diagnosis not present

## 2022-04-02 DIAGNOSIS — Z08 Encounter for follow-up examination after completed treatment for malignant neoplasm: Secondary | ICD-10-CM | POA: Diagnosis not present

## 2022-04-09 ENCOUNTER — Other Ambulatory Visit: Payer: Self-pay | Admitting: Family Medicine

## 2022-04-09 DIAGNOSIS — Z1231 Encounter for screening mammogram for malignant neoplasm of breast: Secondary | ICD-10-CM

## 2022-05-26 ENCOUNTER — Ambulatory Visit: Payer: Medicare Other

## 2022-05-27 ENCOUNTER — Ambulatory Visit
Admission: RE | Admit: 2022-05-27 | Discharge: 2022-05-27 | Disposition: A | Discharge status: Home / Self Care | Payer: Medicare Other | Source: Ambulatory Visit | Attending: Family Medicine | Admitting: Family Medicine

## 2022-05-27 DIAGNOSIS — Z1231 Encounter for screening mammogram for malignant neoplasm of breast: Secondary | ICD-10-CM | POA: Diagnosis not present

## 2022-06-30 DIAGNOSIS — E7800 Pure hypercholesterolemia, unspecified: Secondary | ICD-10-CM | POA: Diagnosis not present

## 2022-06-30 DIAGNOSIS — E559 Vitamin D deficiency, unspecified: Secondary | ICD-10-CM | POA: Diagnosis not present

## 2022-06-30 DIAGNOSIS — Z79899 Other long term (current) drug therapy: Secondary | ICD-10-CM | POA: Diagnosis not present

## 2022-07-06 DIAGNOSIS — Z0000 Encounter for general adult medical examination without abnormal findings: Secondary | ICD-10-CM | POA: Diagnosis not present

## 2022-07-06 DIAGNOSIS — D649 Anemia, unspecified: Secondary | ICD-10-CM | POA: Diagnosis not present

## 2022-07-06 DIAGNOSIS — M810 Age-related osteoporosis without current pathological fracture: Secondary | ICD-10-CM | POA: Diagnosis not present

## 2022-07-06 DIAGNOSIS — Z79899 Other long term (current) drug therapy: Secondary | ICD-10-CM | POA: Diagnosis not present

## 2022-07-06 DIAGNOSIS — E559 Vitamin D deficiency, unspecified: Secondary | ICD-10-CM | POA: Diagnosis not present

## 2022-07-06 DIAGNOSIS — E7800 Pure hypercholesterolemia, unspecified: Secondary | ICD-10-CM | POA: Diagnosis not present

## 2022-07-06 DIAGNOSIS — Z23 Encounter for immunization: Secondary | ICD-10-CM | POA: Diagnosis not present

## 2022-07-07 ENCOUNTER — Other Ambulatory Visit (HOSPITAL_BASED_OUTPATIENT_CLINIC_OR_DEPARTMENT_OTHER): Payer: Self-pay | Admitting: Family Medicine

## 2022-07-07 DIAGNOSIS — E7800 Pure hypercholesterolemia, unspecified: Secondary | ICD-10-CM

## 2022-07-21 ENCOUNTER — Other Ambulatory Visit (HOSPITAL_BASED_OUTPATIENT_CLINIC_OR_DEPARTMENT_OTHER): Payer: Self-pay

## 2022-07-21 DIAGNOSIS — Z23 Encounter for immunization: Secondary | ICD-10-CM | POA: Diagnosis not present

## 2022-07-21 MED ORDER — COVID-19 MRNA VAC-TRIS(PFIZER) 30 MCG/0.3ML IM SUSY
0.3000 mL | PREFILLED_SYRINGE | Freq: Once | INTRAMUSCULAR | 0 refills | Status: AC
  Filled 2022-07-21: qty 0.3, 1d supply, fill #0

## 2022-07-22 DIAGNOSIS — H40013 Open angle with borderline findings, low risk, bilateral: Secondary | ICD-10-CM | POA: Diagnosis not present

## 2022-07-22 DIAGNOSIS — Z961 Presence of intraocular lens: Secondary | ICD-10-CM | POA: Diagnosis not present

## 2022-07-22 DIAGNOSIS — H26491 Other secondary cataract, right eye: Secondary | ICD-10-CM | POA: Diagnosis not present

## 2022-07-22 DIAGNOSIS — H353132 Nonexudative age-related macular degeneration, bilateral, intermediate dry stage: Secondary | ICD-10-CM | POA: Diagnosis not present

## 2022-08-24 ENCOUNTER — Ambulatory Visit (HOSPITAL_BASED_OUTPATIENT_CLINIC_OR_DEPARTMENT_OTHER)
Admission: RE | Admit: 2022-08-24 | Discharge: 2022-08-24 | Disposition: A | Discharge status: Home / Self Care | Payer: Medicare Other | Source: Ambulatory Visit | Attending: Family Medicine | Admitting: Family Medicine

## 2022-08-24 ENCOUNTER — Encounter (HOSPITAL_BASED_OUTPATIENT_CLINIC_OR_DEPARTMENT_OTHER): Payer: Self-pay

## 2022-08-24 ENCOUNTER — Other Ambulatory Visit (HOSPITAL_BASED_OUTPATIENT_CLINIC_OR_DEPARTMENT_OTHER): Payer: Self-pay

## 2022-08-24 DIAGNOSIS — E7800 Pure hypercholesterolemia, unspecified: Secondary | ICD-10-CM | POA: Insufficient documentation

## 2022-08-24 MED ORDER — ZOSTER VAC RECOMB ADJUVANTED 50 MCG/0.5ML IM SUSR
INTRAMUSCULAR | 0 refills | Status: AC
  Filled 2022-08-24: qty 0.5, 1d supply, fill #0

## 2022-09-01 DIAGNOSIS — J8410 Pulmonary fibrosis, unspecified: Secondary | ICD-10-CM | POA: Diagnosis not present

## 2022-09-10 IMAGING — CT CT HEAD W/O CM
4 series · 16 of 47 positions shown, 18 images · non-contrast
Comparison: Face CT reported separately.

CLINICAL DATA: 80-year-old female status post fall in driveway this
morning. Tooth avulsion.

EXAM:
CT HEAD WITHOUT CONTRAST
TECHNIQUE: Contiguous axial images were obtained from the base of the skull
through the vertex without intravenous contrast.

[Series 2: head wo · axial · 0.39mm/px · z∈[+1154,+1269]mm · 7 of 31 slices shown, 9 images]
[im 4/31  brain]
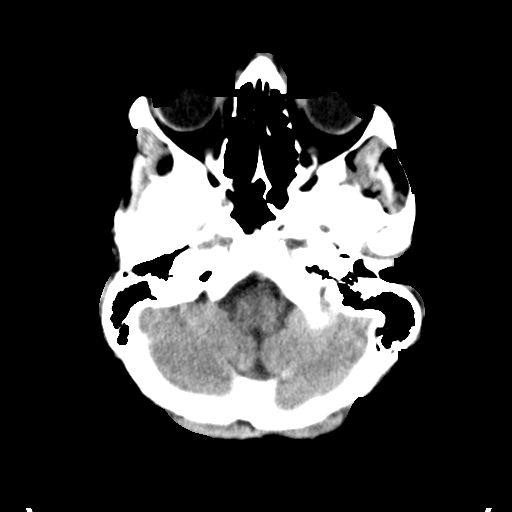
[im 4/31  bone]
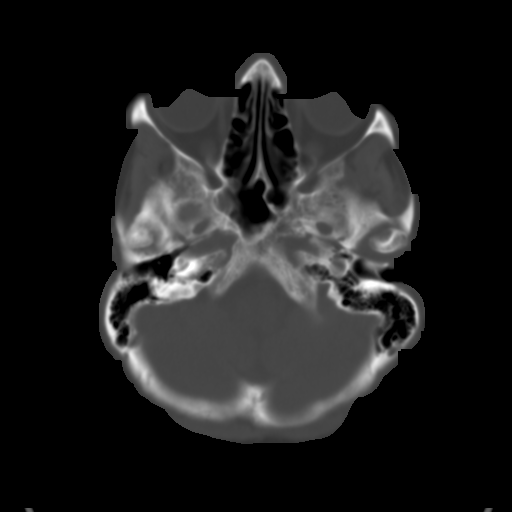
[im 8/31  brain]
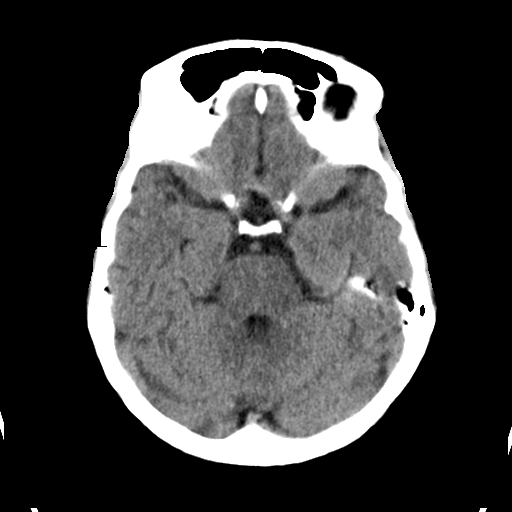
[im 12/31  brain]
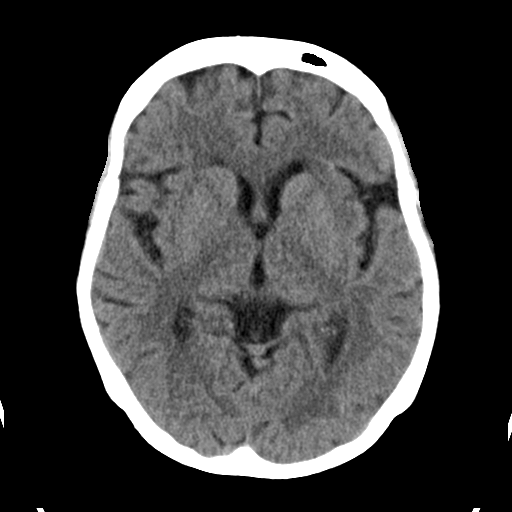
[im 16/31  brain]
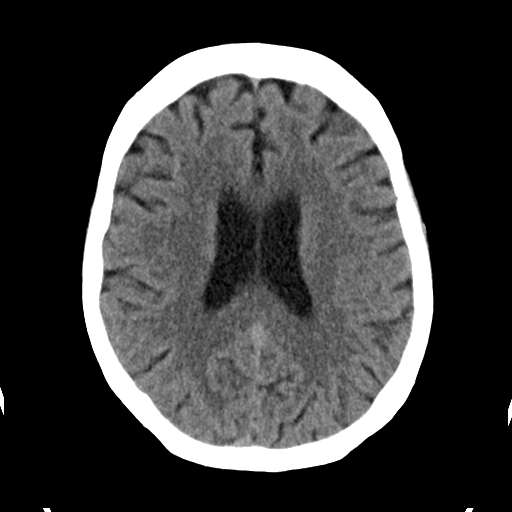
[im 19/31  brain]
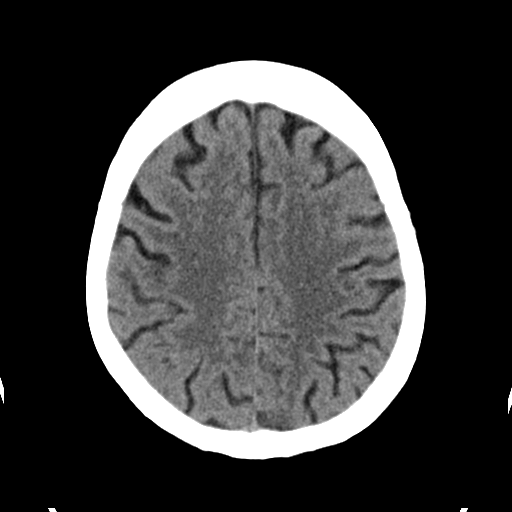
[im 19/31  bone]
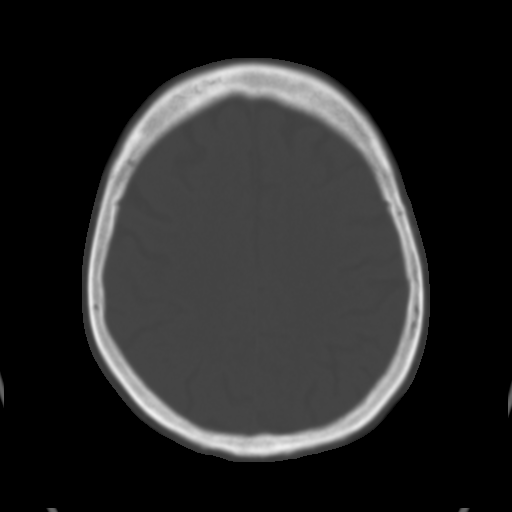
[im 23/31  brain]
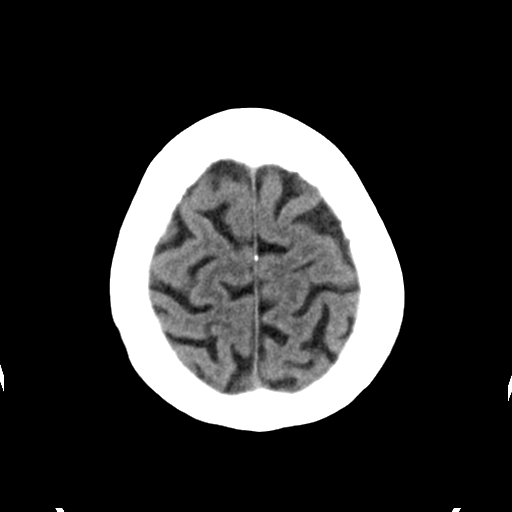
[im 27/31  brain]
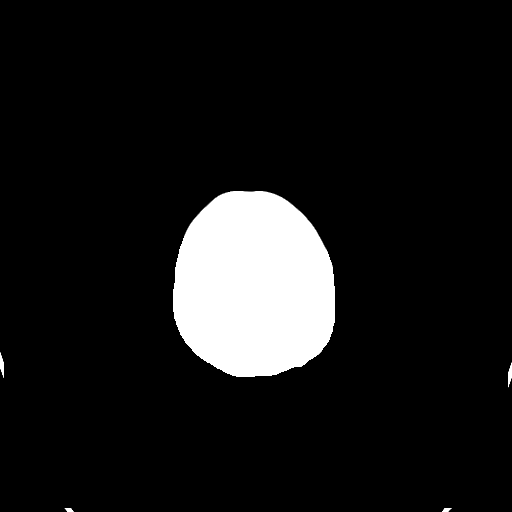

[Series 3: head bone · axial · 0.39mm/px · z∈[+1153,+1183]mm · 3 of 76 slices shown]
[im 8/76  bone]
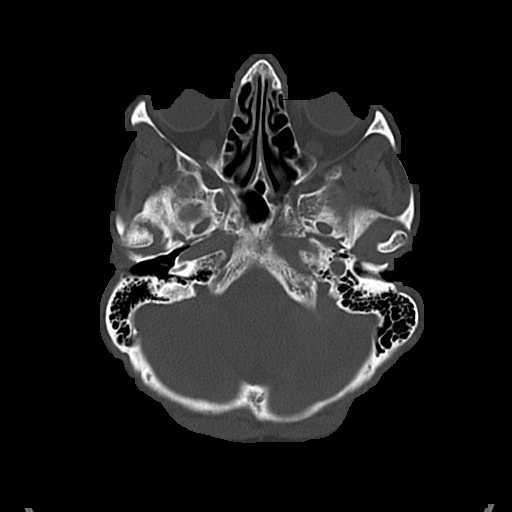
[im 16/76  bone]
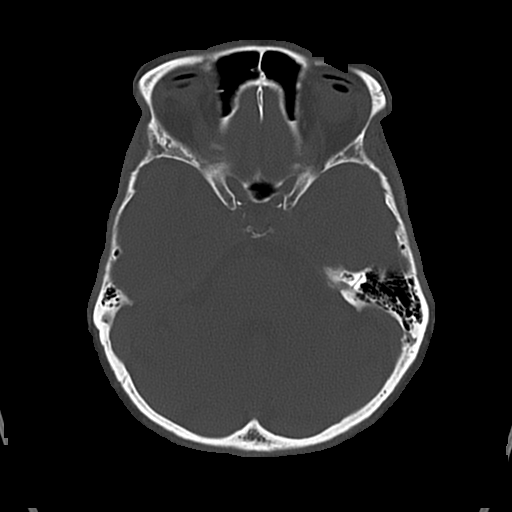
[im 23/76  bone]
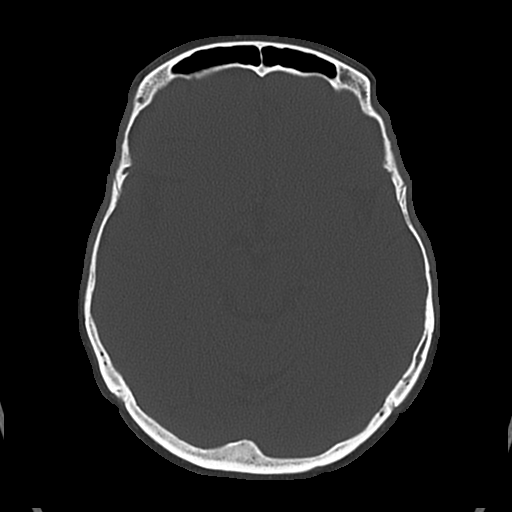

[Series 4: coronal soft · coronal · 0.29mm/px · 3 of 60 slices shown]
[im 20/60  brain]
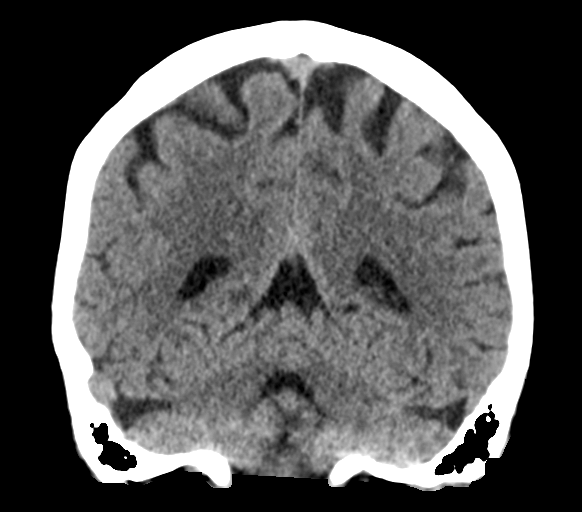
[im 27/60  brain]
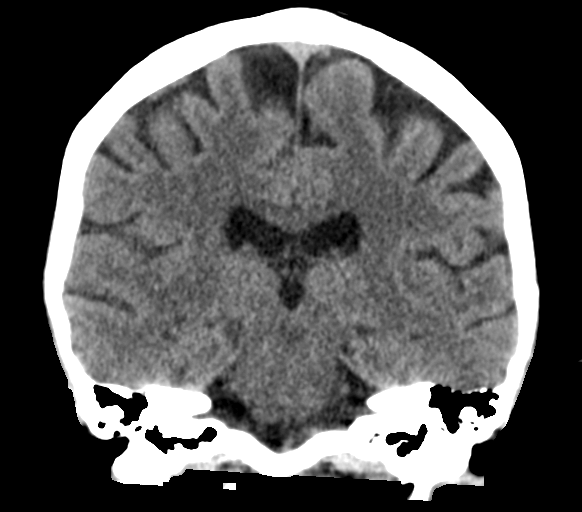
[im 33/60  brain]
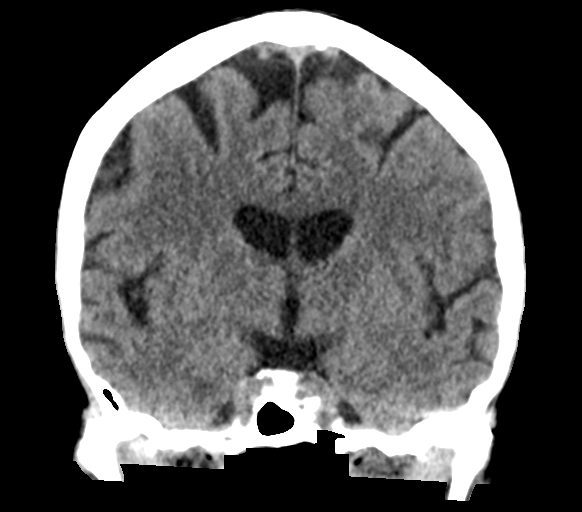

[Series 5: sagittal soft · sagittal · 0.29mm/px · 3 of 56 slices shown]
[im 19/56  brain]
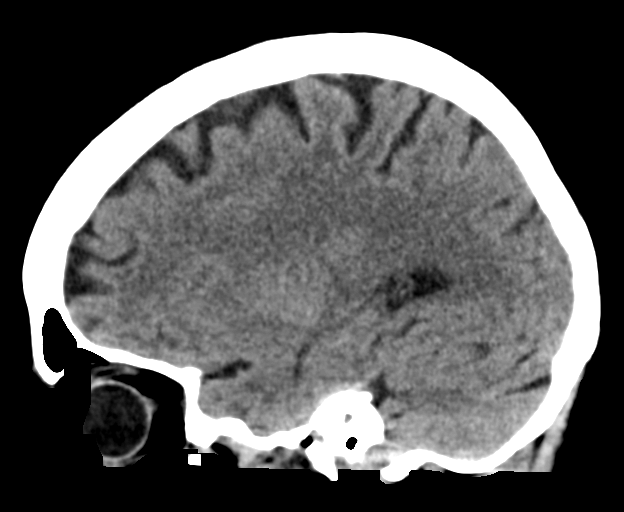
[im 28/56  brain]
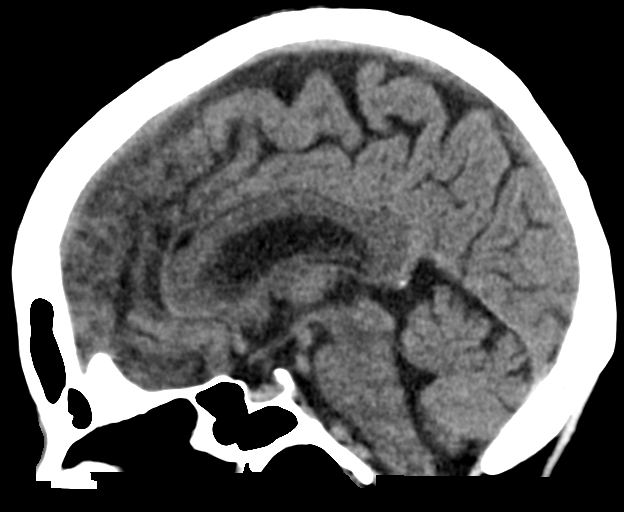
[im 37/56  brain]
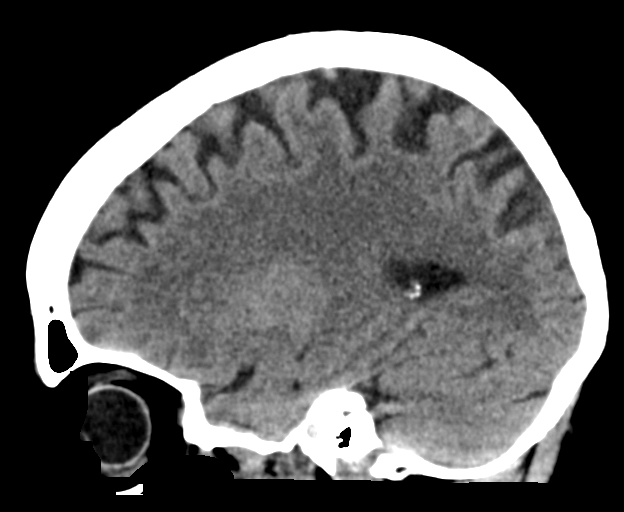

[16 of 47 positions shown; findings below may reference images not displayed]

FINDINGS: Brain: No midline shift, mass effect, or evidence of intracranial
mass lesion. No ventriculomegaly. No acute intracranial hemorrhage
identified.

Partial volume artifact at the anterior cortex of the right middle
frontal gyrus on series 2, image 19 judging from the sagittal
images. Gray-white matter differentiation appears within normal
limits for age throughout the brain. No cortically based acute
infarct identified.

Vascular: Calcified atherosclerosis at the skull base. No suspicious
intracranial vascular hyperdensity.

Skull: No skull fracture identified.

Sinuses/Orbits: Trace fluid in the right sphenoid sinus but other
Visualized paranasal sinuses and mastoids are clear.

Other: Visualized orbits and scalp soft tissues are within normal
limits. Bridge of nose soft tissue swelling, see face CT reported
separately.
IMPRESSION: 1. No acute traumatic injury identified. Normal for age non contrast
CT appearance of the brain.
2. See Face CT reported separately.

## 2022-09-22 ENCOUNTER — Encounter: Payer: Self-pay | Admitting: Emergency Medicine

## 2022-09-22 ENCOUNTER — Ambulatory Visit (INDEPENDENT_AMBULATORY_CARE_PROVIDER_SITE_OTHER): Payer: Medicare Other | Admitting: Emergency Medicine

## 2022-09-22 VITALS — BP 144/84 | HR 80 | Temp 98.0°F | Ht 59.0 in | Wt 87.0 lb

## 2022-09-22 DIAGNOSIS — J8410 Pulmonary fibrosis, unspecified: Secondary | ICD-10-CM | POA: Insufficient documentation

## 2022-09-22 NOTE — Assessment & Plan Note (Signed)
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  She does not have any history of autoimmune disease or any symptoms that would suggest this.  Do not see any evidence for mediastinal anatomical obstruction, impact on an airway or vasculature.  Reassured her that this is a benign finding, likely will never have any clinical significance.  If she develops respiratory symptoms, dyspnea, cough, hemoptysis, etc. then we could consider repeating her imaging.

## 2022-09-22 NOTE — Progress Notes (Signed)
Subjective:    Patient ID: Jasmine Porter, female    DOB: 05-18-1941, 82 y.o.   MRN: [id number]  HPI 82 year old never smoker with a history of breast cancer DCIS (lumpectomy and XRT 2007), osteoporosis, vitamin D deficiency.  She is referred today for an abnormal CT scan of the chest.  She underwent a CT cardiac scoring CT chest 08/24/2022 as below  She is quite healthy, asymptomatic.   Cardiac calcium CT chest 08/24/2022 reviewed by me, shows some calcified mediastinal lymphadenopathy, right upper lobe calcified granuloma 8 mm   Review of Systems As per HPI  Past Medical History:  Diagnosis Date   Breast cancer ([place])      No family history on file.   Social History   Socioeconomic History   Marital status: Married    Spouse name: Not on file   Number of children: Not on file   Years of education: Not on file   Highest education level: Not on file  Occupational History   Not on file  Tobacco Use   Smoking status: Never   Smokeless tobacco: Never  Substance and Sexual Activity   Alcohol use: Not on file   Drug use: Not on file   Sexual activity: Not on file  Other Topics Concern   Not on file  Social History Narrative   Not on file   Social Determinants of Health   Financial Resource Strain: Not on file  Food Insecurity: Not on file  Transportation Needs: Not on file  Physical Activity: Not on file  Stress: Not on file  Social Connections: Not on file  Intimate Partner Violence: Not on file    From [place], [place], [place], [place]  Allergies  Allergen Reactions   Codeine    Penicillins      Outpatient Medications Prior to Visit  Medication Sig Dispense Refill   VITAMIN D PO Take 1 tablet by mouth daily.     COVID-19 mRNA bivalent vaccine, Pfizer, (PFIZER COVID-19 VAC BIVALENT) injection Inject into the muscle. (Patient not taking: Reported on 09/22/2022) 0.3 mL 0   Zoster Vaccine Adjuvanted [facility]) injection Inject into the muscle. (Patient not taking: Reported on  09/22/2022) 0.5 mL 0   No facility-administered medications prior to visit.        Objective:   Physical Exam Vitals:   09/22/22 1351  BP: (!) 144/84  Pulse: 80  Temp: 98 F (36.7 C)  TempSrc: Oral  SpO2: 99%  Weight: 87 lb (39.5 kg)  Height: '4\' 11"'$  (1.499 m)   Gen: Pleasant, well-nourished, in no distress,  normal affect  ENT: No lesions,  mouth clear,  oropharynx clear, no postnasal drip  Neck: No JVD, no stridor  Lungs: No use of accessory muscles, no crackles or wheezing on normal respiration, no wheeze on forced expiration  Cardiovascular: RRR, heart sounds normal, no murmur or gallops, no peripheral edema  Musculoskeletal: No deformities, no cyanosis or clubbing  Neuro: alert, awake, non focal  Skin: Warm, no lesions or rash      Assessment & Plan:   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  She does not have any history of autoimmune disease or any symptoms that would suggest this.  Do not see any evidence for mediastinal anatomical obstruction, impact on an airway or vasculature.  Reassured her that this is a benign finding, likely  will never have any clinical significance.  If she develops respiratory symptoms, dyspnea, cough, hemoptysis, etc. then we could consider repeating her imaging.   Baltazar Apo, MD, PhD 09/22/2022, 2:36 PM [facility] [phone] or if no answer before 7:00PM call [phone] For any issues after 7:00PM please call eLink [phone]

## 2022-09-22 NOTE — Patient Instructions (Signed)
We reviewed your chest imaging today.  There is a calcified right lower lobe granulomatous nodule, calcified central lymph nodes.  These are consistent with a benign cause, likely inflammatory and remote.  You do not need any dedicated follow-up imaging unless there is a clinical change. Please follow-up with Dr. Lamonte Sakai for any concerning respiratory symptoms.

## 2022-12-29 ENCOUNTER — Other Ambulatory Visit (HOSPITAL_BASED_OUTPATIENT_CLINIC_OR_DEPARTMENT_OTHER): Payer: Self-pay

## 2022-12-29 MED ORDER — ZOSTER VAC RECOMB ADJUVANTED 50 MCG/0.5ML IM SUSR
0.5000 mL | INTRAMUSCULAR | 0 refills | Status: AC
  Filled 2022-12-29: qty 0.5, 1d supply, fill #0

## 2023-01-10 DIAGNOSIS — R7301 Impaired fasting glucose: Secondary | ICD-10-CM | POA: Diagnosis not present

## 2023-01-10 DIAGNOSIS — D649 Anemia, unspecified: Secondary | ICD-10-CM | POA: Diagnosis not present

## 2023-01-17 DIAGNOSIS — D649 Anemia, unspecified: Secondary | ICD-10-CM | POA: Diagnosis not present

## 2023-05-26 ENCOUNTER — Other Ambulatory Visit: Payer: Self-pay | Admitting: Family Medicine

## 2023-05-26 DIAGNOSIS — Z0000 Encounter for general adult medical examination without abnormal findings: Secondary | ICD-10-CM

## 2023-06-09 ENCOUNTER — Ambulatory Visit
Admission: RE | Admit: 2023-06-09 | Discharge: 2023-06-09 | Disposition: A | Discharge status: Home / Self Care | Payer: Medicare Other | Source: Ambulatory Visit | Attending: Family Medicine | Admitting: Family Medicine

## 2023-06-09 DIAGNOSIS — Z1231 Encounter for screening mammogram for malignant neoplasm of breast: Secondary | ICD-10-CM | POA: Diagnosis not present

## 2023-06-09 DIAGNOSIS — Z0000 Encounter for general adult medical examination without abnormal findings: Secondary | ICD-10-CM

## 2023-07-05 DIAGNOSIS — Z79899 Other long term (current) drug therapy: Secondary | ICD-10-CM | POA: Diagnosis not present

## 2023-07-05 DIAGNOSIS — E7800 Pure hypercholesterolemia, unspecified: Secondary | ICD-10-CM | POA: Diagnosis not present

## 2023-07-05 DIAGNOSIS — E559 Vitamin D deficiency, unspecified: Secondary | ICD-10-CM | POA: Diagnosis not present

## 2023-07-05 DIAGNOSIS — R7301 Impaired fasting glucose: Secondary | ICD-10-CM | POA: Diagnosis not present

## 2023-07-08 DIAGNOSIS — I10 Essential (primary) hypertension: Secondary | ICD-10-CM | POA: Diagnosis not present

## 2023-07-08 DIAGNOSIS — E559 Vitamin D deficiency, unspecified: Secondary | ICD-10-CM | POA: Diagnosis not present

## 2023-07-08 DIAGNOSIS — E7800 Pure hypercholesterolemia, unspecified: Secondary | ICD-10-CM | POA: Diagnosis not present

## 2023-07-08 DIAGNOSIS — D649 Anemia, unspecified: Secondary | ICD-10-CM | POA: Diagnosis not present

## 2023-07-08 DIAGNOSIS — M810 Age-related osteoporosis without current pathological fracture: Secondary | ICD-10-CM | POA: Diagnosis not present

## 2023-07-08 DIAGNOSIS — Z0000 Encounter for general adult medical examination without abnormal findings: Secondary | ICD-10-CM | POA: Diagnosis not present

## 2023-07-08 DIAGNOSIS — R7301 Impaired fasting glucose: Secondary | ICD-10-CM | POA: Diagnosis not present

## 2023-07-08 DIAGNOSIS — Z79899 Other long term (current) drug therapy: Secondary | ICD-10-CM | POA: Diagnosis not present

## 2023-07-08 DIAGNOSIS — Z23 Encounter for immunization: Secondary | ICD-10-CM | POA: Diagnosis not present

## 2023-07-25 DIAGNOSIS — H40013 Open angle with borderline findings, low risk, bilateral: Secondary | ICD-10-CM | POA: Diagnosis not present

## 2023-07-25 DIAGNOSIS — H524 Presbyopia: Secondary | ICD-10-CM | POA: Diagnosis not present

## 2023-07-25 DIAGNOSIS — H26491 Other secondary cataract, right eye: Secondary | ICD-10-CM | POA: Diagnosis not present

## 2023-07-25 DIAGNOSIS — H353132 Nonexudative age-related macular degeneration, bilateral, intermediate dry stage: Secondary | ICD-10-CM | POA: Diagnosis not present

## 2023-08-09 ENCOUNTER — Other Ambulatory Visit (HOSPITAL_BASED_OUTPATIENT_CLINIC_OR_DEPARTMENT_OTHER): Payer: Self-pay

## 2023-08-09 DIAGNOSIS — Z23 Encounter for immunization: Secondary | ICD-10-CM | POA: Diagnosis not present

## 2023-08-09 MED ORDER — COMIRNATY 30 MCG/0.3ML IM SUSY
0.3000 mL | PREFILLED_SYRINGE | Freq: Once | INTRAMUSCULAR | 0 refills | Status: AC
  Filled 2023-08-09: qty 0.3, 1d supply, fill #0

## 2023-10-07 DIAGNOSIS — R7303 Prediabetes: Secondary | ICD-10-CM | POA: Diagnosis not present

## 2023-10-07 DIAGNOSIS — I10 Essential (primary) hypertension: Secondary | ICD-10-CM | POA: Diagnosis not present

## 2023-10-07 DIAGNOSIS — E559 Vitamin D deficiency, unspecified: Secondary | ICD-10-CM | POA: Diagnosis not present

## 2023-10-07 DIAGNOSIS — N1831 Chronic kidney disease, stage 3a: Secondary | ICD-10-CM | POA: Diagnosis not present

## 2024-05-07 ENCOUNTER — Other Ambulatory Visit: Payer: Self-pay | Admitting: Family Medicine

## 2024-05-07 DIAGNOSIS — Z1231 Encounter for screening mammogram for malignant neoplasm of breast: Secondary | ICD-10-CM

## 2024-06-11 ENCOUNTER — Ambulatory Visit

## 2024-07-10 DIAGNOSIS — E7800 Pure hypercholesterolemia, unspecified: Secondary | ICD-10-CM | POA: Diagnosis not present

## 2024-07-10 DIAGNOSIS — R7303 Prediabetes: Secondary | ICD-10-CM | POA: Diagnosis not present

## 2024-07-10 DIAGNOSIS — E559 Vitamin D deficiency, unspecified: Secondary | ICD-10-CM | POA: Diagnosis not present

## 2024-07-10 DIAGNOSIS — Z79899 Other long term (current) drug therapy: Secondary | ICD-10-CM | POA: Diagnosis not present

## 2024-07-13 DIAGNOSIS — Z79899 Other long term (current) drug therapy: Secondary | ICD-10-CM | POA: Diagnosis not present

## 2024-07-13 DIAGNOSIS — Z0000 Encounter for general adult medical examination without abnormal findings: Secondary | ICD-10-CM | POA: Diagnosis not present

## 2024-07-13 DIAGNOSIS — R7303 Prediabetes: Secondary | ICD-10-CM | POA: Diagnosis not present

## 2024-07-13 DIAGNOSIS — E559 Vitamin D deficiency, unspecified: Secondary | ICD-10-CM | POA: Diagnosis not present

## 2024-07-13 DIAGNOSIS — M810 Age-related osteoporosis without current pathological fracture: Secondary | ICD-10-CM | POA: Diagnosis not present

## 2024-07-13 DIAGNOSIS — I10 Essential (primary) hypertension: Secondary | ICD-10-CM | POA: Diagnosis not present

## 2024-07-13 DIAGNOSIS — Z23 Encounter for immunization: Secondary | ICD-10-CM | POA: Diagnosis not present

## 2024-07-13 DIAGNOSIS — E7800 Pure hypercholesterolemia, unspecified: Secondary | ICD-10-CM | POA: Diagnosis not present

## 2024-07-24 ENCOUNTER — Ambulatory Visit
Admission: RE | Admit: 2024-07-24 | Discharge: 2024-07-24 | Disposition: A | Source: Ambulatory Visit | Attending: Family Medicine

## 2024-07-24 DIAGNOSIS — Z1231 Encounter for screening mammogram for malignant neoplasm of breast: Secondary | ICD-10-CM

## 2024-08-28 ENCOUNTER — Other Ambulatory Visit (HOSPITAL_BASED_OUTPATIENT_CLINIC_OR_DEPARTMENT_OTHER): Payer: Self-pay

## 2024-08-28 MED ORDER — AREXVY 120 MCG/0.5ML IM SUSR
0.5000 mL | Freq: Once | INTRAMUSCULAR | 0 refills | Status: AC
  Filled 2024-08-28: qty 0.5, 1d supply, fill #0
